# Patient Record
Sex: Male | Born: 1985 | Race: Black or African American | Hispanic: No | Marital: Single | Smoking: Former smoker
Health system: Southern US, Community
[De-identification: ages and names within clinical notes are randomized; demographics above are authoritative.]

## PROBLEM LIST (undated history)

## (undated) DIAGNOSIS — E119 Type 2 diabetes mellitus without complications: Secondary | ICD-10-CM

## (undated) HISTORY — PX: NO PAST SURGERIES: SHX2092

---
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
## 2005-08-23 ENCOUNTER — Emergency Department (HOSPITAL_COMMUNITY): Admission: EM | Admit: 2005-08-23 | Discharge: 2005-08-23 | Payer: Self-pay | Admitting: Emergency Medicine

## 2007-05-30 ENCOUNTER — Emergency Department (HOSPITAL_COMMUNITY): Admission: EM | Admit: 2007-05-30 | Discharge: 2007-05-30 | Payer: Self-pay | Admitting: Emergency Medicine

## 2008-10-19 ENCOUNTER — Telehealth (INDEPENDENT_AMBULATORY_CARE_PROVIDER_SITE_OTHER): Payer: Self-pay | Admitting: *Deleted

## 2008-10-20 ENCOUNTER — Emergency Department (HOSPITAL_COMMUNITY): Admission: EM | Admit: 2008-10-20 | Discharge: 2008-10-20 | Payer: Self-pay | Admitting: Emergency Medicine

## 2011-01-05 ENCOUNTER — Emergency Department (HOSPITAL_COMMUNITY)
Admission: EM | Admit: 2011-01-05 | Discharge: 2011-01-05 | Payer: Self-pay | Source: Home / Self Care | Admitting: Family Medicine

## 2011-07-01 IMAGING — CR DG CHEST 2V
2 series · 2 of 2 positions shown · non-contrast
Comparison: None

CLINICAL DATA: Cough.  Upper respiratory tract infection.

CHEST - 2 VIEW

[view not recorded (1 of 2)]
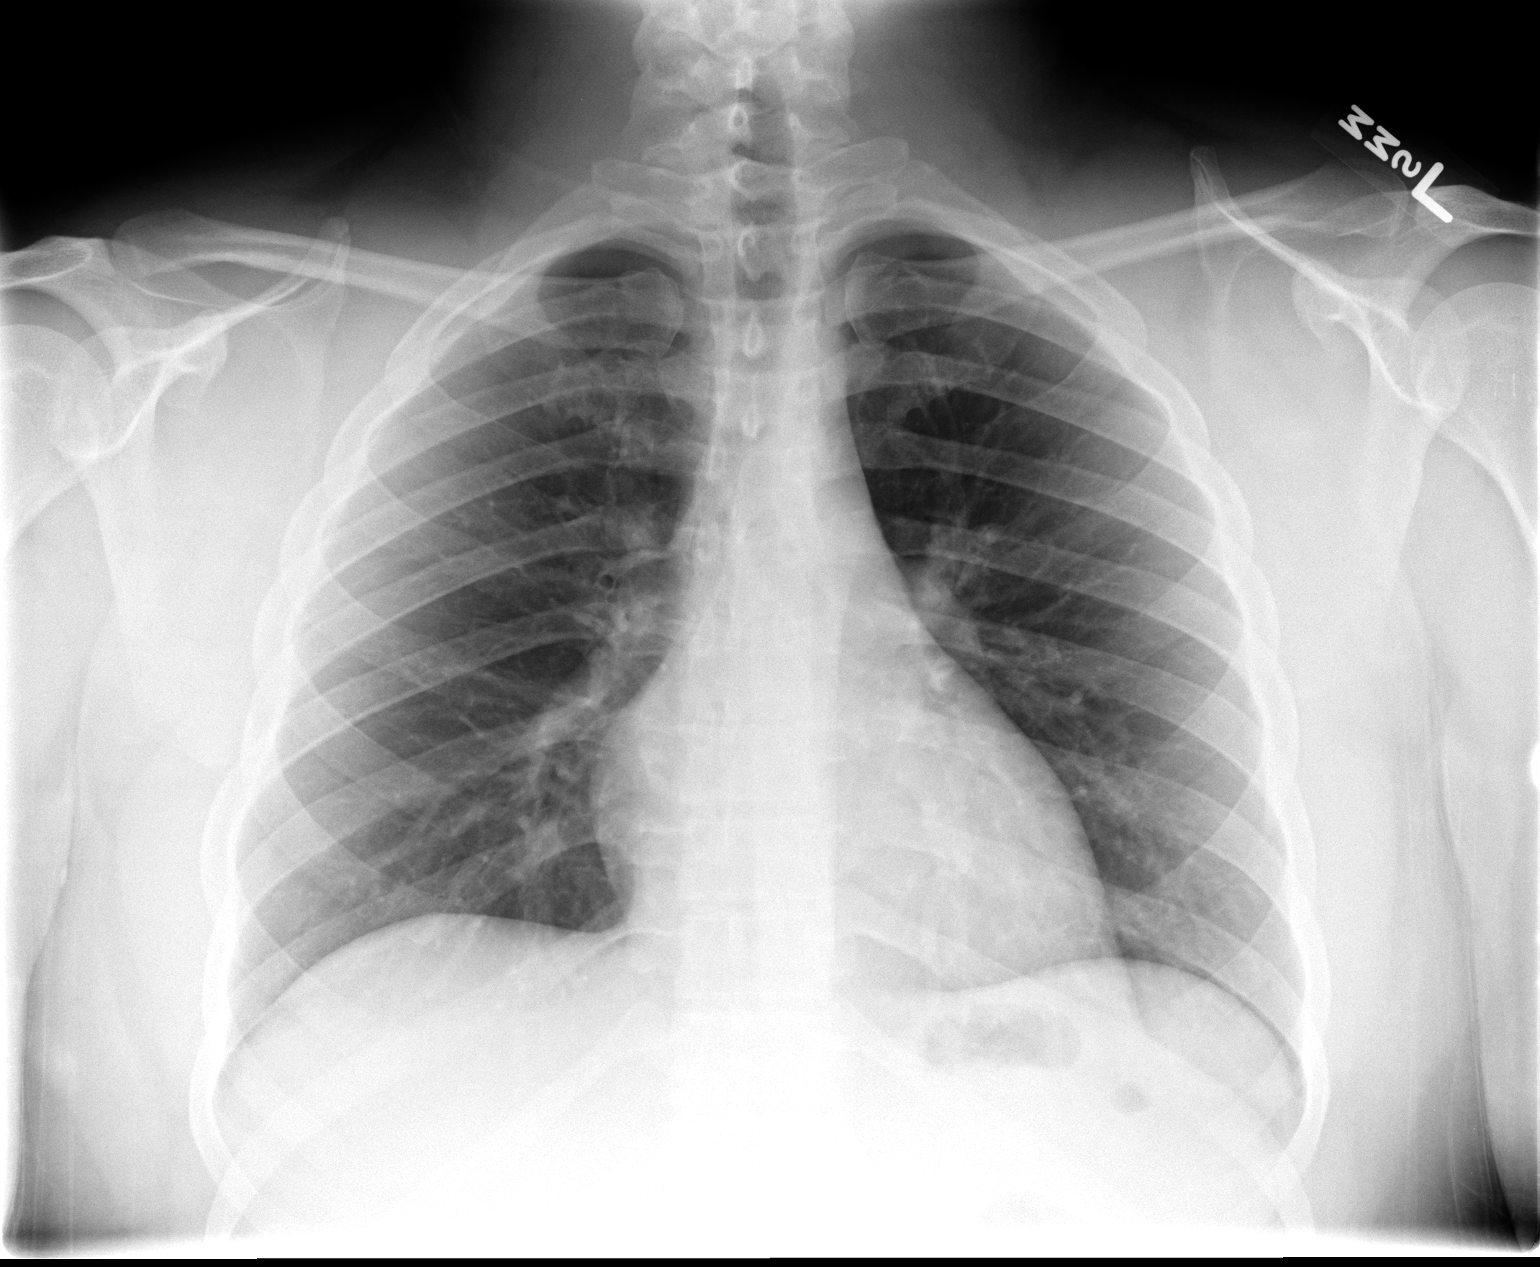

[view not recorded (2 of 2)]
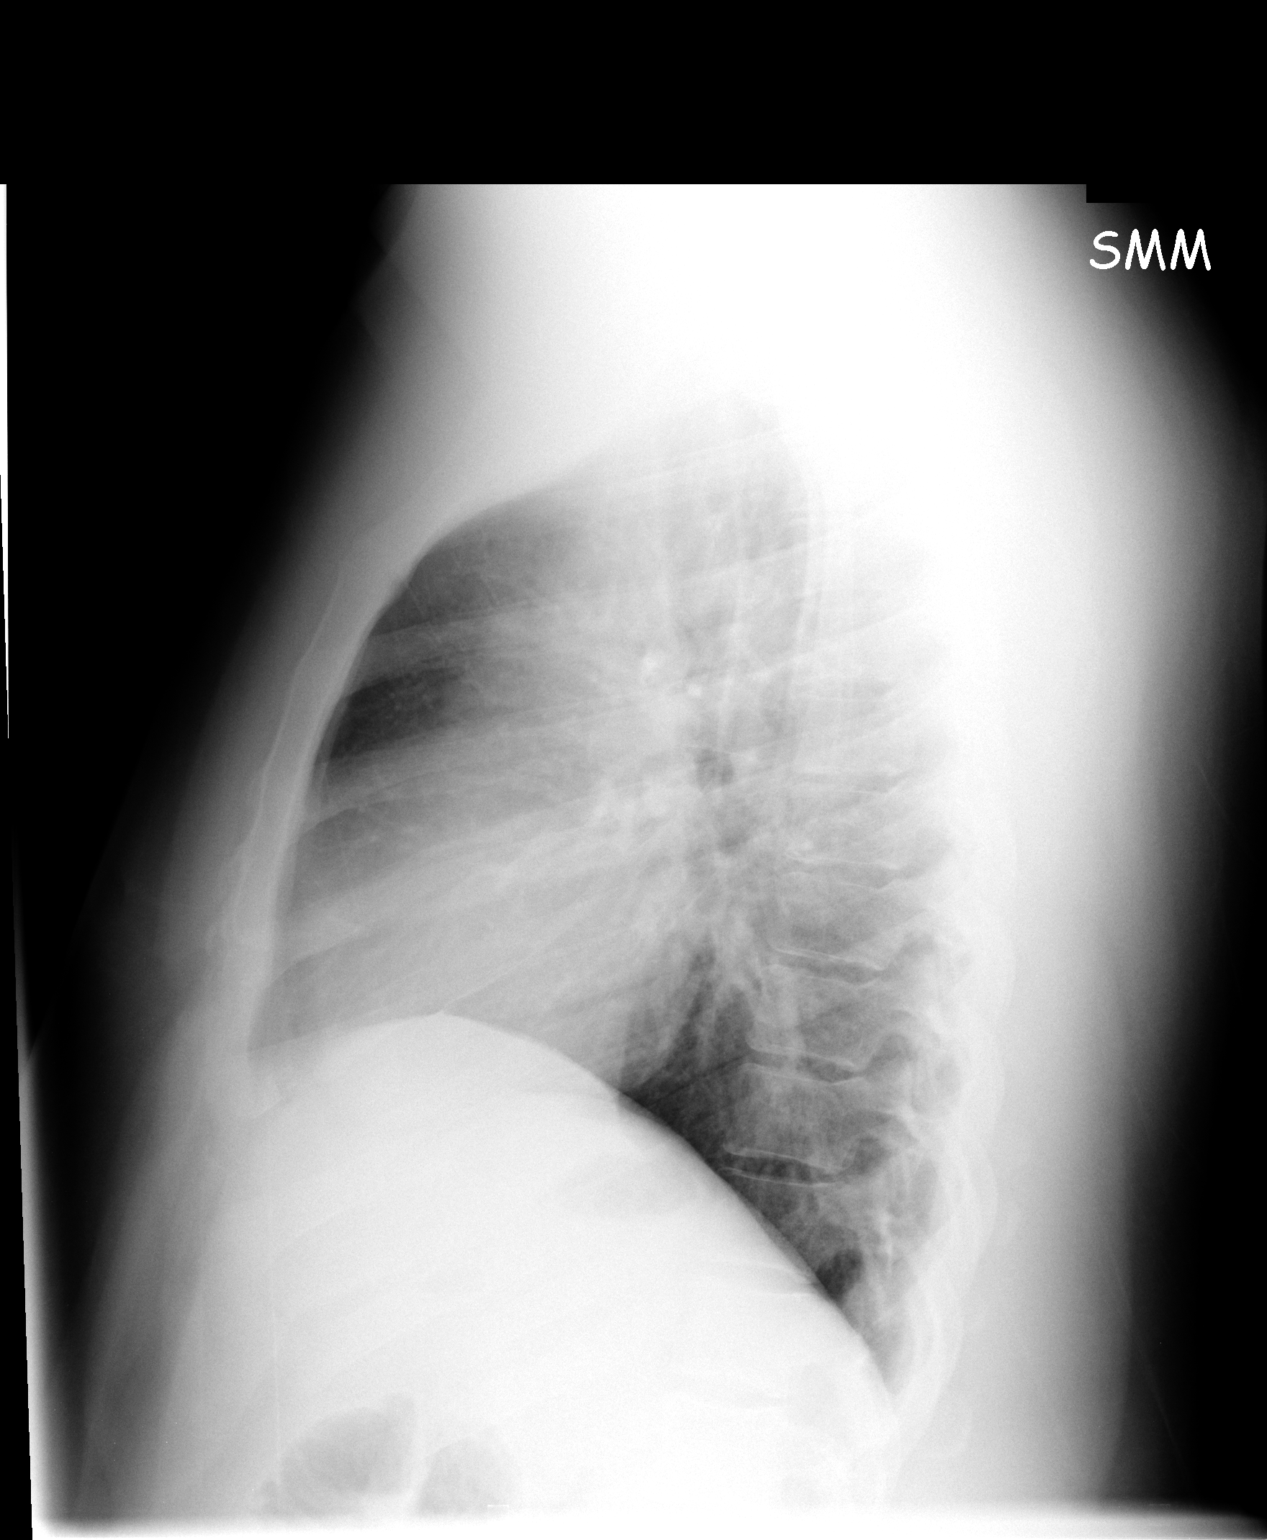

[2 of 2 positions shown; findings below may reference images not displayed]

FINDINGS: The heart size and mediastinal contours are within normal
limits.  Both lungs are clear.  The visualized skeletal structures
are unremarkable.
IMPRESSION: No active cardiopulmonary abnormalities.

## 2011-09-26 LAB — CULTURE, ROUTINE-ABSCESS

## 2019-06-09 ENCOUNTER — Ambulatory Visit (INDEPENDENT_AMBULATORY_CARE_PROVIDER_SITE_OTHER): Payer: BC Managed Care – PPO | Admitting: Family Medicine

## 2019-06-09 ENCOUNTER — Other Ambulatory Visit: Payer: Self-pay

## 2019-06-09 ENCOUNTER — Encounter: Payer: Self-pay | Admitting: Family Medicine

## 2019-06-09 DIAGNOSIS — G4719 Other hypersomnia: Secondary | ICD-10-CM | POA: Diagnosis not present

## 2019-06-09 DIAGNOSIS — R0681 Apnea, not elsewhere classified: Secondary | ICD-10-CM

## 2019-06-09 DIAGNOSIS — R0683 Snoring: Secondary | ICD-10-CM

## 2019-06-09 NOTE — Progress Notes (Deleted)
Patient ID: Ernest Austin, male   DOB: 1986-04-01, 33 y.o.   MRN: [id number]    No sleep  Reports that his wife over 1 year  Fatigued and wake up feels has  He now works 1st year   [employer] and 5'2 He gained 40 lbs over the last year Occasionally has shortness or breath with exertional  More recent headaches.  He Doses  He has a uncle

## 2019-06-09 NOTE — Progress Notes (Deleted)
Worked up patient for their telephone visit with provider Molli Barrows, FNP-C. Verified date of birth. Patient has concerns about sleep apnea. Has the following complaints: loud snoring, daytime sleepiness, feels like he's choking in his sleep, wife states that he stops breathing in his sleep & he feels like neck size is greater than 17". Has never been treated for BP issues, is less than 50 KWalker, CMA.
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
## 2019-06-25 ENCOUNTER — Telehealth: Payer: Self-pay

## 2019-06-25 NOTE — Telephone Encounter (Signed)
Called patient to do their pre-visit COVID screening.  Have you been tested for COVID or are you currently waiting for COVID test results? no  Have you recently traveled internationally([place], [place], [place], [place], [place]) or within the [place] to a hotspot area([place], [place], [place], [place], [place])? no  Are you currently experiencing any of the following: fever, cough, SHOB, fatigue, body aches, loss of smell, rash, diarrhea, vomiting, severe headaches, weakness, sore throat? no  Have you been in contact with anyone who has recently travelled? no  Have you been in contact with anyone who is experiencing any of the above symptoms or been diagnosed with COVID  or works in or has recently visited a SNF? no  Reminded patient to come fasting for bloodwork,

## 2019-06-26 ENCOUNTER — Other Ambulatory Visit: Payer: Self-pay

## 2019-06-26 ENCOUNTER — Encounter: Payer: Self-pay | Admitting: Family Medicine

## 2019-06-26 ENCOUNTER — Ambulatory Visit (INDEPENDENT_AMBULATORY_CARE_PROVIDER_SITE_OTHER): Payer: BC Managed Care – PPO | Admitting: Family Medicine

## 2019-06-26 VITALS — BP 146/92 | HR 90 | Temp 97.3°F | Resp 18 | Ht 62.0 in | Wt 278.0 lb

## 2019-06-26 DIAGNOSIS — Z114 Encounter for screening for human immunodeficiency virus [HIV]: Secondary | ICD-10-CM

## 2019-06-26 DIAGNOSIS — Z6841 Body Mass Index (BMI) 40.0 and over, adult: Secondary | ICD-10-CM

## 2019-06-26 DIAGNOSIS — Z13220 Encounter for screening for lipoid disorders: Secondary | ICD-10-CM

## 2019-06-26 DIAGNOSIS — R030 Elevated blood-pressure reading, without diagnosis of hypertension: Secondary | ICD-10-CM | POA: Diagnosis not present

## 2019-06-26 DIAGNOSIS — Z1389 Encounter for screening for other disorder: Secondary | ICD-10-CM

## 2019-06-26 DIAGNOSIS — Z0001 Encounter for general adult medical examination with abnormal findings: Secondary | ICD-10-CM | POA: Diagnosis not present

## 2019-06-26 DIAGNOSIS — Z0000 Encounter for general adult medical examination without abnormal findings: Secondary | ICD-10-CM

## 2019-06-26 DIAGNOSIS — Z130 Encounter for screening for diseases of the blood and blood-forming organs and certain disorders involving the immune mechanism: Secondary | ICD-10-CM

## 2019-06-26 DIAGNOSIS — Z131 Encounter for screening for diabetes mellitus: Secondary | ICD-10-CM

## 2019-06-26 DIAGNOSIS — Z1329 Encounter for screening for other suspected endocrine disorder: Secondary | ICD-10-CM

## 2019-06-26 NOTE — Progress Notes (Signed)
Patient ID: Ernest Austin, male    DOB: February 14, 1986, 33 y.o.   MRN: [id number]  PCP: Scot Jun, FNP  Chief Complaint  Patient presents with  . Annual Exam    Subjective:  HPI Ernest Austin is a 33 y.o. male, former smoker presents for complete physical exam.  Chronic conditions: suspect sleep apnea, sleep study pending    Health Promotion: Routine physical activity: No routine physical activity. Current BMI: Body mass index is 50.85 kg/m. Dietary habits: skips breakfast. Lunch and Dinner. Snacks at least 3-4 times per the day. Drinks water occasionally. Cook mostly at home. Endorses heavy intake of soda, average 8 per day.  Health Screening Current/Overdue:   Immunizations: up to date  Last Dental Exam: Last Eye Exam:  January 2019    Family History  Problem Relation Age of Onset  . Diabetes Mother   . Hypertension Father   . Heart attack Neg Hx   . Stroke Neg Hx     No Known Allergies  Social History   Socioeconomic History  . Marital status: Single    Spouse name: Not on file  . Number of children: Not on file  . Years of education: Not on file  . Highest education level: Not on file  Occupational History  . Not on file  Social Needs  . Financial resource strain: Not on file  . Food insecurity    Worry: Not on file    Inability: Not on file  . Transportation needs    Medical: Not on file    Non-medical: Not on file  Tobacco Use  . Smoking status: Former Smoker    Quit date: 2016    Years since quitting: 4.5  . Smokeless tobacco: Never Used  Substance and Sexual Activity  . Alcohol use: Not on file  . Drug use: Not on file  . Sexual activity: Not on file  Lifestyle  . Physical activity    Days per week: Not on file    Minutes per session: Not on file  . Stress: Not on file  Relationships  . Social Herbalist on phone: Not on file    Gets together: Not on file    Attends religious service: Not on file    Active  member of club or organization: Not on file    Attends meetings of clubs or organizations: Not on file    Relationship status: Not on file  . Intimate partner violence    Fear of current or ex partner: Not on file    Emotionally abused: Not on file    Physically abused: Not on file    Forced sexual activity: Not on file  Other Topics Concern  . Not on file  Social History Narrative  . Not on file    Review of Systems  Pertinent negatives listed in HPI  Past Medical, Surgical Family and Social History reviewed and updated.  Objective:   Today's Vitals   06/26/19 0854  BP: (!) 146/92  Pulse: 90  Resp: 18  Temp: (!) 97.3 F (36.3 C)  TempSrc: Temporal  SpO2: 96%  Weight: 278 lb (126.1 kg)  Height: 5\' 2"  (1.575 m)    Wt Readings from Last 3 Encounters:  06/26/19 278 lb (126.1 kg)    Physical Exam Constitutional: Patient appears well-developed and well-nourished. No distress. HENT: Normocephalic, atraumatic, External right and left ear normal. Oropharynx is clear and moist.  Eyes: Conjunctivae and EOM  are normal. PERRLA, no scleral icterus. Neck: Normal ROM. Neck supple. No JVD. No tracheal deviation. No thyromegaly. CVS: RRR, S1/S2 +, no murmurs, no gallops, no carotid bruit.  Pulmonary: Effort and breath sounds normal, no stridor, rhonchi, wheezes, rales.  Abdominal: Soft. BS +, no distension, tenderness, rebound or guarding.  Musculoskeletal: Normal range of motion. No edema and no tenderness.  Neuro: Alert. Normal reflexes, muscle tone coordination. No cranial nerve deficit. Skin: Skin is warm and dry. No rash noted. Not diaphoretic. No erythema. No pallor. Psychiatric: Normal mood and affect. Behavior, judgment, thought content normal.   Assessment & Plan:  1. Annual physical exam Age-appropriate anticipatory guidance provided.  2. Screening for diabetes mellitus - Hemoglobin A1c - Comprehensive metabolic panel  3. Screening for thyroid disorder - Thyroid  Panel With TSH  4. Screening for deficiency anemia - CBC with Differential  5. Screening, lipid  -Lipid panel  6. Screening for blood or protein in urine - POCT URINALYSIS DIP (CLINITEK)  7. Screening for HIV (human immunodeficiency virus) - HIV antibody (with reflex)   8. Body mass index (BMI) of 50-59.9 in adult [facility](HCC) Encouraged efforts to reduce weight include engaging in physical activity as tolerated with goal of 150 minutes per week. Improve dietary choices and eat a meal regimen consistent with a Mediterranean or DASH diet. Reduce simple carbohydrates. Do not skip meals and eat healthy snacks throughout the day to avoid over-eating at dinner. Set a goal weight loss that is achievable for you.  9.  Elevated blood pressure reading without a diagnosis of hypertension Elevated x2 readings today.  Patient will return in 2 weeks for blood pressure follow-up.  If blood pressure remains greater than 140/90 will start losartan 25 mg once daily.  Patient has multiple risk factors for cardiovascular disease consisting of metal, African-American, morbid obesity, sedentary lifestyle, poor diet.  Joaquin CourtsKimberly Lawanna Cecere, FNP [facility] [address], [place] [id number] [phone]: [phone]

## 2019-06-26 NOTE — Patient Instructions (Addendum)
Preventive Care 34-34 Years Old, Male Preventive care refers to lifestyle choices and visits with your health care provider that can promote health and wellness. This includes:  A yearly physical exam. This is also called an annual well check.  Regular dental and eye exams.  Immunizations.  Screening for certain conditions.  Healthy lifestyle choices, such as eating a healthy diet, getting regular exercise, not using drugs or products that contain nicotine and tobacco, and limiting alcohol use. What can I expect for my preventive care visit? Physical exam Your health care provider will check:  Height and weight. These may be used to calculate body mass index (BMI), which is a measurement that tells if you are at a healthy weight.  Heart rate and blood pressure.  Your skin for abnormal spots. Counseling Your health care provider may ask you questions about:  Alcohol, tobacco, and drug use.  Emotional well-being.  Home and relationship well-being.  Sexual activity.  Eating habits.  Work and work Statistician. What immunizations do I need?  Influenza (flu) vaccine  This is recommended every year. Tetanus, diphtheria, and pertussis (Tdap) vaccine  You may need a Td booster every 10 years. Varicella (chickenpox) vaccine  You may need this vaccine if you have not already been vaccinated. Human papillomavirus (HPV) vaccine  If recommended by your health care provider, you may need three doses over 6 months. Measles, mumps, and rubella (MMR) vaccine  You may need at least one dose of MMR. You may also need a second dose. Meningococcal conjugate (MenACWY) vaccine  One dose is recommended if you are 34-13 years old and a Market researcher living in a residence hall, or if you have one of several medical conditions. You may also need additional booster doses. Pneumococcal conjugate (PCV13) vaccine  You may need this if you have certain conditions and were not  previously vaccinated. Pneumococcal polysaccharide (PPSV23) vaccine  You may need one or two doses if you smoke cigarettes or if you have certain conditions. Hepatitis A vaccine  You may need this if you have certain conditions or if you travel or work in places where you may be exposed to hepatitis A. Hepatitis B vaccine  You may need this if you have certain conditions or if you travel or work in places where you may be exposed to hepatitis B. Haemophilus influenzae type b (Hib) vaccine  You may need this if you have certain risk factors. You may receive vaccines as individual doses or as more than one vaccine together in one shot (combination vaccines). Talk with your health care provider about the risks and benefits of combination vaccines. What tests do I need? Blood tests  Lipid and cholesterol levels. These may be checked every 5 years starting at age 34.  Hepatitis C test.  Hepatitis B test. Screening   Diabetes screening. This is done by checking your blood sugar (glucose) after you have not eaten for a while (fasting).  Sexually transmitted disease (STD) testing. Talk with your health care provider about your test results, treatment options, and if necessary, the need for more tests. Follow these instructions at home: Eating and drinking   Eat a diet that includes fresh fruits and vegetables, whole grains, lean protein, and low-fat dairy products.  Take vitamin and mineral supplements as recommended by your health care provider.  Do not drink alcohol if your health care provider tells you not to drink.  If you drink alcohol: ? Limit how much you have to 0-2  drinks a day. ? Be aware of how much alcohol is in your drink. In the [place] one drink equals one 12 oz bottle of beer (355 mL), one 5 oz glass of wine (148 mL), or one 1 oz glass of hard liquor (44 mL). Lifestyle  Take daily care of your teeth and gums.  Stay active. Exercise for at least 30 minutes on 5 or  more days each week.  Do not use any products that contain nicotine or tobacco, such as cigarettes, e-cigarettes, and chewing tobacco. If you need help quitting, ask your health care provider.  If you are sexually active, practice safe sex. Use a condom or other form of protection to prevent STIs (sexually transmitted infections). What's next?  Go to your health care provider once a year for a well check visit.  Ask your health care provider how often you should have your eyes and teeth checked.  Stay up to date on all vaccines. This information is not intended to replace advice given to you by your health care provider. Make sure you discuss any questions you have with your health care provider. Document Released: 01/22/2002 Document Revised: 11/20/2018 Document Reviewed: 11/20/2018 Elsevier Patient Education  2020 [place] Maintenance, Male Adopting a healthy lifestyle and getting preventive care are important in promoting health and wellness. Ask your health care provider about:  The right schedule for you to have regular tests and exams.  Things you can do on your own to prevent diseases and keep yourself healthy. What should I know about diet, weight, and exercise? Eat a healthy diet   Eat a diet that includes plenty of vegetables, fruits, low-fat dairy products, and lean protein.  Do not eat a lot of foods that are high in solid fats, added sugars, or sodium. Maintain a healthy weight Body mass index (BMI) is a measurement that can be used to identify possible weight problems. It estimates body fat based on height and weight. Your health care provider can help determine your BMI and help you achieve or maintain a healthy weight. Get regular exercise Get regular exercise. This is one of the most important things you can do for your health. Most adults should:  Exercise for at least 150 minutes each week. The exercise should increase your heart rate and make you  sweat (moderate-intensity exercise).  Do strengthening exercises at least twice a week. This is in addition to the moderate-intensity exercise.  Spend less time sitting. Even light physical activity can be beneficial. Watch cholesterol and blood lipids Have your blood tested for lipids and cholesterol at 34 years of age, then have this test every 5 years. You may need to have your cholesterol levels checked more often if:  Your lipid or cholesterol levels are high.  You are older than 34 years of age.  You are at high risk for heart disease. What should I know about cancer screening? Many types of cancers can be detected early and may often be prevented. Depending on your health history and family history, you may need to have cancer screening at various ages. This may include screening for:  Colorectal cancer.  Prostate cancer.  Skin cancer.  Lung cancer. What should I know about heart disease, diabetes, and high blood pressure? Blood pressure and heart disease  High blood pressure causes heart disease and increases the risk of stroke. This is more likely to develop in people who have high blood pressure readings, are of African descent, or  are overweight.  Talk with your health care provider about your target blood pressure readings.  Have your blood pressure checked: ? Every 3-5 years if you are 28-42 years of age. ? Every year if you are 32 years old or older.  If you are between the ages of 43 and 10 and are a current or former smoker, ask your health care provider if you should have a one-time screening for abdominal aortic aneurysm (AAA). Diabetes Have regular diabetes screenings. This checks your fasting blood sugar level. Have the screening done:  Once every three years after age 4 if you are at a normal weight and have a low risk for diabetes.  More often and at a younger age if you are overweight or have a high risk for diabetes. What should I know about  preventing infection? Hepatitis B If you have a higher risk for hepatitis B, you should be screened for this virus. Talk with your health care provider to find out if you are at risk for hepatitis B infection. Hepatitis C Blood testing is recommended for:  Everyone born from [birth year] through 1966.  Anyone with known risk factors for hepatitis C. Sexually transmitted infections (STIs)  You should be screened each year for STIs, including gonorrhea and chlamydia, if: ? You are sexually active and are younger than 34 years of age. ? You are older than 34 years of age and your health care provider tells you that you are at risk for this type of infection. ? Your sexual activity has changed since you were last screened, and you are at increased risk for chlamydia or gonorrhea. Ask your health care provider if you are at risk.  Ask your health care provider about whether you are at high risk for HIV. Your health care provider may recommend a prescription medicine to help prevent HIV infection. If you choose to take medicine to prevent HIV, you should first get tested for HIV. You should then be tested every 3 months for as long as you are taking the medicine. Follow these instructions at home: Lifestyle  Do not use any products that contain nicotine or tobacco, such as cigarettes, e-cigarettes, and chewing tobacco. If you need help quitting, ask your health care provider.  Do not use street drugs.  Do not share needles.  Ask your health care provider for help if you need support or information about quitting drugs. Alcohol use  Do not drink alcohol if your health care provider tells you not to drink.  If you drink alcohol: ? Limit how much you have to 0-2 drinks a day. ? Be aware of how much alcohol is in your drink. In the [place] one drink equals one 12 oz bottle of beer (355 mL), one 5 oz glass of wine (148 mL), or one 1 oz glass of hard liquor (44 mL). General instructions  Schedule  regular health, dental, and eye exams.  Stay current with your vaccines.  Tell your health care provider if: ? You often feel depressed. ? You have ever been abused or do not feel safe at home. Summary  Adopting a healthy lifestyle and getting preventive care are important in promoting health and wellness.  Follow your health care provider's instructions about healthy diet, exercising, and getting tested or screened for diseases.  Follow your health care provider's instructions on monitoring your cholesterol and blood pressure. This information is not intended to replace advice given to you by your health care provider. Make sure you  discuss any questions you have with your health care provider. Document Released: 05/24/2008 Document Revised: 11/19/2018 Document Reviewed: 11/19/2018 Elsevier Patient Education  2020 [employer].    Hypertension, Adult Hypertension is another name for high blood pressure. High blood pressure forces your heart to work harder to pump blood. This can cause problems over time. There are two numbers in a blood pressure reading. There is a top number (systolic) over a bottom number (diastolic). It is best to have a blood pressure that is below 120/80. Healthy choices can help lower your blood pressure, or you may need medicine to help lower it. What are the causes? The cause of this condition is not known. Some conditions may be related to high blood pressure. What increases the risk?  Smoking.  Having type 2 diabetes mellitus, high cholesterol, or both.  Not getting enough exercise or physical activity.  Being overweight.  Having too much fat, sugar, calories, or salt (sodium) in your diet.  Drinking too much alcohol.  Having long-term (chronic) kidney disease.  Having a family history of high blood pressure.  Age. Risk increases with age.  Race. You may be at higher risk if you are African American.  Gender. Men are at higher risk than  women before age 59. After age 67, women are at higher risk than men.  Having obstructive sleep apnea.  Stress. What are the signs or symptoms?  High blood pressure may not cause symptoms. Very high blood pressure (hypertensive crisis) may cause: ? Headache. ? Feelings of worry or nervousness (anxiety). ? Shortness of breath. ? Nosebleed. ? A feeling of being sick to your stomach (nausea). ? Throwing up (vomiting). ? Changes in how you see. ? Very bad chest pain. ? Seizures. How is this treated?  This condition is treated by making healthy lifestyle changes, such as: ? Eating healthy foods. ? Exercising more. ? Drinking less alcohol.  Your health care provider may prescribe medicine if lifestyle changes are not enough to get your blood pressure under control, and if: ? Your top number is above 130. ? Your bottom number is above 80.  Your personal target blood pressure may vary. Follow these instructions at home: Eating and drinking   If told, follow the DASH eating plan. To follow this plan: ? Fill one half of your plate at each meal with fruits and vegetables. ? Fill one fourth of your plate at each meal with whole grains. Whole grains include whole-wheat pasta, brown rice, and whole-grain bread. ? Eat or drink low-fat dairy products, such as skim milk or low-fat yogurt. ? Fill one fourth of your plate at each meal with low-fat (lean) proteins. Low-fat proteins include fish, chicken without skin, eggs, beans, and tofu. ? Avoid fatty meat, cured and processed meat, or chicken with skin. ? Avoid pre-made or processed food.  Eat less than 1,500 mg of salt each day.  Do not drink alcohol if: ? Your doctor tells you not to drink. ? You are pregnant, may be pregnant, or are planning to become pregnant.  If you drink alcohol: ? Limit how much you use to:  0-1 drink a day for women.  0-2 drinks a day for men. ? Be aware of how much alcohol is in your drink. In the [place]  one drink equals one 12 oz bottle of beer (355 mL), one 5 oz glass of wine (148 mL), or one 1 oz glass of hard liquor (44 mL). Lifestyle   Work with your  doctor to stay at a healthy weight or to lose weight. Ask your doctor what the best weight is for you.  Get at least 30 minutes of exercise most days of the week. This may include walking, swimming, or biking.  Get at least 30 minutes of exercise that strengthens your muscles (resistance exercise) at least 3 days a week. This may include lifting weights or doing Pilates.  Do not use any products that contain nicotine or tobacco, such as cigarettes, e-cigarettes, and chewing tobacco. If you need help quitting, ask your doctor.  Check your blood pressure at home as told by your doctor.  Keep all follow-up visits as told by your doctor. This is important. Medicines  Take over-the-counter and prescription medicines only as told by your doctor. Follow directions carefully.  Do not skip doses of blood pressure medicine. The medicine does not work as well if you skip doses. Skipping doses also puts you at risk for problems.  Ask your doctor about side effects or reactions to medicines that you should watch for. Contact a doctor if you:  Think you are having a reaction to the medicine you are taking.  Have headaches that keep coming back (recurring).  Feel dizzy.  Have swelling in your ankles.  Have trouble with your vision. Get help right away if you:  Get a very bad headache.  Start to feel mixed up (confused).  Feel weak or numb.  Feel faint.  Have very bad pain in your: ? Chest. ? Belly (abdomen).  Throw up more than once.  Have trouble breathing. Summary  Hypertension is another name for high blood pressure.  High blood pressure forces your heart to work harder to pump blood.  For most people, a normal blood pressure is less than 120/80.  Making healthy choices can help lower blood pressure. If your blood  pressure does not get lower with healthy choices, you may need to take medicine. This information is not intended to replace advice given to you by your health care provider. Make sure you discuss any questions you have with your health care provider. Document Released: 05/14/2008 Document Revised: 08/06/2018 Document Reviewed: 08/06/2018 Elsevier Patient Education  2020 [place] and Cholesterol Restricted Eating Plan Getting too much fat and cholesterol in your diet may cause health problems. Choosing the right foods helps keep your fat and cholesterol at normal levels. This can keep you from getting certain diseases. Your doctor may recommend an eating plan that includes:  Total fat: ______% or less of total calories a day.  Saturated fat: ______% or less of total calories a day.  Cholesterol: less than _________mg a day.  Fiber: ______g a day. What are tips for following this plan? Meal planning  At meals, divide your plate into four equal parts: ? Fill one-half of your plate with vegetables and green salads. ? Fill one-fourth of your plate with whole grains. ? Fill one-fourth of your plate with low-fat (lean) protein foods.  Eat fish that is high in omega-3 fats at least two times a week. This includes mackerel, tuna, sardines, and salmon.  Eat foods that are high in fiber, such as whole grains, beans, apples, broccoli, carrots, peas, and barley. General tips   Work with your doctor to lose weight if you need to.  Avoid: ? Foods with added sugar. ? Fried foods. ? Foods with partially hydrogenated oils.  Limit alcohol intake to no more than 1 drink a day for nonpregnant women  and 2 drinks a day for men. One drink equals 12 oz of beer, 5 oz of wine, or 1 oz of hard liquor. Reading food labels  Check food labels for: ? Trans fats. ? Partially hydrogenated oils. ? Saturated fat (g) in each serving. ? Cholesterol (mg) in each serving. ? Fiber (g) in each  serving.  Choose foods with healthy fats, such as: ? Monounsaturated fats. ? Polyunsaturated fats. ? Omega-3 fats.  Choose grain products that have whole grains. Look for the word "whole" as the first word in the ingredient list. Cooking  Cook foods using low-fat methods. These include baking, boiling, grilling, and broiling.  Eat more home-cooked foods. Eat at restaurants and buffets less often.  Avoid cooking using saturated fats, such as butter, cream, palm oil, palm kernel oil, and coconut oil. Recommended foods  Fruits  All fresh, canned (in natural juice), or frozen fruits. Vegetables  Fresh or frozen vegetables (raw, steamed, roasted, or grilled). Green salads. Grains  Whole grains, such as whole wheat or whole grain breads, crackers, cereals, and pasta. Unsweetened oatmeal, bulgur, barley, quinoa, or brown rice. Corn or whole wheat flour tortillas. Meats and other protein foods  Ground beef (85% or leaner), grass-fed beef, or beef trimmed of fat. Skinless chicken or [place]. Ground chicken or [place]. Pork trimmed of fat. All fish and seafood. Egg whites. Dried beans, peas, or lentils. Unsalted nuts or seeds. Unsalted canned beans. Nut butters without added sugar or oil. Dairy  Low-fat or nonfat dairy products, such as skim or 1% milk, 2% or reduced-fat cheeses, low-fat and fat-free ricotta or cottage cheese, or plain low-fat and nonfat yogurt. Fats and oils  Tub margarine without trans fats. Light or reduced-fat mayonnaise and salad dressings. Avocado. Olive, canola, sesame, or safflower oils. The items listed above may not be a complete list of foods and beverages you can eat. Contact a dietitian for more information. Foods to avoid Fruits  Canned fruit in heavy syrup. Fruit in cream or butter sauce. Fried fruit. Vegetables  Vegetables cooked in cheese, cream, or butter sauce. Fried vegetables. Grains  White bread. White pasta. White rice. Cornbread. Bagels,  pastries, and croissants. Crackers and snack foods that contain trans fat and hydrogenated oils. Meats and other protein foods  Fatty cuts of meat. Ribs, chicken wings, bacon, sausage, bologna, salami, chitterlings, fatback, hot dogs, bratwurst, and packaged lunch meats. Liver and organ meats. Whole eggs and egg yolks. Chicken and [place] with skin. Fried meat. Dairy  Whole or 2% milk, cream, half-and-half, and cream cheese. Whole milk cheeses. Whole-fat or sweetened yogurt. Full-fat cheeses. Nondairy creamers and whipped toppings. Processed cheese, cheese spreads, and cheese curds. Beverages  Alcohol. Sugar-sweetened drinks such as sodas, lemonade, and fruit drinks. Fats and oils  Butter, stick margarine, lard, shortening, ghee, or bacon fat. Coconut, palm kernel, and palm oils. Sweets and desserts  Corn syrup, sugars, honey, and molasses. Candy. Jam and jelly. Syrup. Sweetened cereals. Cookies, pies, cakes, donuts, muffins, and ice cream. The items listed above may not be a complete list of foods and beverages you should avoid. Contact a dietitian for more information. Summary  Choosing the right foods helps keep your fat and cholesterol at normal levels. This can keep you from getting certain diseases.  At meals, fill one-half of your plate with vegetables and green salads.  Eat high-fiber foods, like whole grains, beans, apples, carrots, peas, and barley.  Limit added sugar, saturated fats, alcohol, and fried foods. This information is not  intended to replace advice given to you by your health care provider. Make sure you discuss any questions you have with your health care provider. Document Released: 05/27/2012 Document Revised: 07/30/2018 Document Reviewed: 08/13/2017 Elsevier Patient Education  2020 [employer].

## 2019-06-27 LAB — CBC WITH DIFFERENTIAL/PLATELET
Basophils Absolute: 0 10*3/uL (ref 0.0–0.2)
Basos: 0 %
EOS (ABSOLUTE): 0.1 10*3/uL (ref 0.0–0.4)
Eos: 2 %
Hematocrit: 41.6 % (ref 37.5–51.0)
Hemoglobin: 13.8 g/dL (ref 13.0–17.7)
Immature Grans (Abs): 0 10*3/uL (ref 0.0–0.1)
Immature Granulocytes: 0 %
Lymphocytes Absolute: 2.2 10*3/uL (ref 0.7–3.1)
Lymphs: 41 %
MCH: 30.1 pg (ref 26.6–33.0)
MCHC: 33.2 g/dL (ref 31.5–35.7)
MCV: 91 fL (ref 79–97)
Monocytes Absolute: 0.4 10*3/uL (ref 0.1–0.9)
Monocytes: 7 %
Neutrophils Absolute: 2.7 10*3/uL (ref 1.4–7.0)
Neutrophils: 50 %
Platelets: 322 10*3/uL (ref 150–450)
RBC: 4.58 x10E6/uL (ref 4.14–5.80)
RDW: 13.7 % (ref 11.6–15.4)
WBC: 5.4 10*3/uL (ref 3.4–10.8)

## 2019-06-27 LAB — COMPREHENSIVE METABOLIC PANEL
ALT: 27 IU/L (ref 0–44)
AST: 18 IU/L (ref 0–40)
Albumin/Globulin Ratio: 1.6 (ref 1.2–2.2)
Albumin: 4.3 g/dL (ref 4.0–5.0)
Alkaline Phosphatase: 85 IU/L (ref 39–117)
BUN/Creatinine Ratio: 7 — ABNORMAL LOW (ref 9–20)
BUN: 7 mg/dL (ref 6–20)
Bilirubin Total: 0.3 mg/dL (ref 0.0–1.2)
CO2: 22 mmol/L (ref 20–29)
Calcium: 9.4 mg/dL (ref 8.7–10.2)
Chloride: 102 mmol/L (ref 96–106)
Creatinine, Ser: 1.01 mg/dL (ref 0.76–1.27)
GFR calc Af Amer: 112 mL/min/{1.73_m2} (ref >59)
GFR calc non Af Amer: 97 mL/min/{1.73_m2} (ref >59)
Globulin, Total: 2.7 g/dL (ref 1.5–4.5)
Glucose: 129 mg/dL — ABNORMAL HIGH (ref 65–99)
Potassium: 3.8 mmol/L (ref 3.5–5.2)
Sodium: 139 mmol/L (ref 134–144)
Total Protein: 7 g/dL (ref 6.0–8.5)

## 2019-06-27 LAB — HEMOGLOBIN A1C
Est. average glucose Bld gHb Est-mCnc: 160 mg/dL
Hgb A1c MFr Bld: 7.2 % — ABNORMAL HIGH (ref 4.8–5.6)

## 2019-06-27 LAB — LIPID PANEL
Chol/HDL Ratio: 9.3 ratio — ABNORMAL HIGH (ref 0.0–5.0)
Cholesterol, Total: 242 mg/dL — ABNORMAL HIGH (ref 100–199)
HDL: 26 mg/dL — ABNORMAL LOW (ref >39)
LDL Calculated: 190 mg/dL — ABNORMAL HIGH (ref 0–99)
Triglycerides: 131 mg/dL (ref 0–149)
VLDL Cholesterol Cal: 26 mg/dL (ref 5–40)

## 2019-06-27 LAB — THYROID PANEL WITH TSH
Free Thyroxine Index: 1.7 (ref 1.2–4.9)
T3 Uptake Ratio: 26 % (ref 24–39)
T4, Total: 6.6 ug/dL (ref 4.5–12.0)
TSH: 2.41 u[IU]/mL (ref 0.450–4.500)

## 2019-06-27 LAB — HIV ANTIBODY (ROUTINE TESTING W REFLEX): HIV Screen 4th Generation wRfx: NONREACTIVE

## 2019-06-29 ENCOUNTER — Other Ambulatory Visit: Payer: Self-pay | Admitting: Family Medicine

## 2019-06-29 DIAGNOSIS — E119 Type 2 diabetes mellitus without complications: Secondary | ICD-10-CM

## 2019-06-29 DIAGNOSIS — E782 Mixed hyperlipidemia: Secondary | ICD-10-CM

## 2019-06-29 NOTE — Telephone Encounter (Signed)
Left voicemail asking patient to return call to primary care at [facility] at [phone]. Nat Christen, CMA

## 2019-06-29 NOTE — Telephone Encounter (Signed)
Please contact patient to advise of the following regarding his most recent labs:  A1C is 7.2, which is consistent with type 2 diabetes. He doesn't require insulin, however, I would like for him to start metformin 500 mg twice daily. Cholesterol is also abnormal and the American Diabetic Association also recommended treatment of cholesterol to reduce the risk of a heart attack or stroke.  Would like for him to also start atorvastatin 20 mg once a day. American Diabetic Association also recommends starting antihypertensive medication for kidney protection. Given BP was elevated during recent office visit, would like for him to start losartan 25 mg. Return in 8 weeks, fasting for repeat cholesterol check. Return in 3 week for blood pressure check after starting lisinopril.  Please schedule follow-up as patient has no follow-up on file.  A prescription for a glucometer is being sent to the pharmacy and I have referred you to diabetes education for further instruction on proper management of diabetes.  Route message back to once patient notified as I would like to send additional diabetes education via his Mychart.

## 2019-06-30 MED ORDER — ATORVASTATIN CALCIUM 20 MG PO TABS: 20.0000 mg | ORAL_TABLET | Freq: Every day | ORAL | 3 refills | Status: DC

## 2019-06-30 MED ORDER — METFORMIN HCL 500 MG PO TABS: 500.0000 mg | ORAL_TABLET | Freq: Two times a day (BID) | ORAL | 3 refills | Status: DC

## 2019-06-30 MED ORDER — BLOOD GLUCOSE MONITOR KIT: PACK | 0 refills | Status: AC

## 2019-06-30 MED ORDER — LOSARTAN POTASSIUM 25 MG PO TABS: 25.0000 mg | ORAL_TABLET | Freq: Every day | ORAL | 3 refills | Status: DC

## 2019-06-30 NOTE — Telephone Encounter (Signed)
Patient notified of lab results & recommendations. Expressed understanding. Is agreeable to all medication changes listed below. Prescriptions sent to pharmacy on file. Patient aware that he will be receiving a call from Diabetes Education. Scheduled nurse visit for 07/27/2019 @ 2 PM. Scheduled fasting lab appointment for 08/24/2019 @ 8:30 AM.  Sent diabetes education material to patient via [place].

## 2019-07-01 ENCOUNTER — Other Ambulatory Visit (HOSPITAL_COMMUNITY)
Admission: RE | Admit: 2019-07-01 | Discharge: 2019-07-01 | Disposition: A | Discharge status: Home / Self Care | Payer: BC Managed Care – PPO | Source: Ambulatory Visit | Attending: Internal Medicine | Admitting: Internal Medicine

## 2019-07-01 DIAGNOSIS — Z1159 Encounter for screening for other viral diseases: Secondary | ICD-10-CM | POA: Insufficient documentation

## 2019-07-01 LAB — SARS CORONAVIRUS 2 (TAT 6-24 HRS): SARS Coronavirus 2: NEGATIVE

## 2019-07-04 ENCOUNTER — Ambulatory Visit (HOSPITAL_BASED_OUTPATIENT_CLINIC_OR_DEPARTMENT_OTHER): Payer: BC Managed Care – PPO | Attending: Family Medicine | Admitting: Internal Medicine

## 2019-07-04 ENCOUNTER — Other Ambulatory Visit: Payer: Self-pay

## 2019-07-04 ENCOUNTER — Encounter: Payer: Self-pay | Admitting: Family Medicine

## 2019-07-04 DIAGNOSIS — R0683 Snoring: Secondary | ICD-10-CM | POA: Diagnosis present

## 2019-07-04 DIAGNOSIS — R0681 Apnea, not elsewhere classified: Secondary | ICD-10-CM

## 2019-07-04 DIAGNOSIS — G4733 Obstructive sleep apnea (adult) (pediatric): Secondary | ICD-10-CM | POA: Insufficient documentation

## 2019-07-04 DIAGNOSIS — G4719 Other hypersomnia: Secondary | ICD-10-CM | POA: Diagnosis not present

## 2019-07-05 DIAGNOSIS — R0681 Apnea, not elsewhere classified: Secondary | ICD-10-CM | POA: Insufficient documentation

## 2019-07-06 ENCOUNTER — Other Ambulatory Visit: Payer: Self-pay

## 2019-07-06 ENCOUNTER — Other Ambulatory Visit (HOSPITAL_BASED_OUTPATIENT_CLINIC_OR_DEPARTMENT_OTHER): Payer: Self-pay

## 2019-07-06 DIAGNOSIS — R0681 Apnea, not elsewhere classified: Secondary | ICD-10-CM

## 2019-07-06 DIAGNOSIS — R0683 Snoring: Secondary | ICD-10-CM

## 2019-07-06 DIAGNOSIS — G4719 Other hypersomnia: Secondary | ICD-10-CM

## 2019-07-08 DIAGNOSIS — R0681 Apnea, not elsewhere classified: Secondary | ICD-10-CM | POA: Diagnosis not present

## 2019-07-08 NOTE — Procedures (Signed)
Patient Name: Ernest Austin, Ernest Austin Date: 07/04/2019 Gender: Male D.O.B: 03/24/1986 Age (years): 32 Referring Provider: Molli Barrows FNP Height (inches): 60 Interpreting Physician: Baird Lyons MD, ABSM Weight (lbs): 177 RPSGT: Lanae Boast BMI: 32 MRN: [id number] Neck Size: 18.75  CLINICAL INFORMATION Sleep Study Type: Split Night CPAP Indication for sleep study: Excessive Daytime Sleepiness, Snoring, Witnessed Apneas Epworth Sleepiness Score: 14  SLEEP STUDY TECHNIQUE As per the AASM Manual for the Scoring of Sleep and Associated Events v2.3 (April 2016) with a hypopnea requiring 4% desaturations.  The channels recorded and monitored were frontal, central and occipital EEG, electrooculogram (EOG), submentalis EMG (chin), nasal and oral airflow, thoracic and abdominal wall motion, anterior tibialis EMG, snore microphone, electrocardiogram, and pulse oximetry. Continuous positive airway pressure (CPAP) was initiated when the patient met split night criteria and was titrated according to treat sleep-disordered breathing.  MEDICATIONS Medications self-administered by patient taken the night of the study : none reported  RESPIRATORY PARAMETERS Diagnostic  Total AHI (/hr): 12.3 RDI (/hr): 12.9 OA Index (/hr): 0.3 CA Index (/hr): 0.0 REM AHI (/hr): 38.7 NREM AHI (/hr): 9.8 Supine AHI (/hr): 12.3 Non-supine AHI (/hr): 0 Min O2 Sat (%): 86.0 Mean O2 (%): 94.6 Time below 88% (min): 0.7   Titration  Optimal Pressure (cm): 10 AHI at Optimal Pressure (/hr): 0.0 Min O2 at Optimal Pressure (%): 93.0 Supine % at Optimal (%): 12 Sleep % at Optimal (%): 98   SLEEP ARCHITECTURE The recording time for the entire night was 423.6 minutes.  During a baseline period of 238.9 minutes, the patient slept for 180.9 minutes in REM and nonREM, yielding a sleep efficiency of 75.7%%. Sleep onset after lights out was 24.5 minutes with a REM latency of 85.0 minutes. The patient spent  15.2%% of the night in stage N1 sleep, 76.2%% in stage N2 sleep, 0.0%% in stage N3 and 8.6% in REM.  During the titration period of 182.9 minutes, the patient slept for 119.2 minutes in REM and nonREM, yielding a sleep efficiency of 65.2%%. Sleep onset after CPAP initiation was 0.7 minutes with a REM latency of 98.5 minutes. The patient spent 14.7%% of the night in stage N1 sleep, 60.6%% in stage N2 sleep, 0.0%% in stage N3 and 24.8% in REM.  CARDIAC DATA The 2 lead EKG demonstrated sinus rhythm. The mean heart rate was 100.0 beats per minute. Other EKG findings include: None.  LEG MOVEMENT DATA The total Periodic Limb Movements of Sleep (PLMS) were 0. The PLMS index was 0.0 .  IMPRESSIONS - Mild obstructive sleep apnea occurred during the diagnostic portion of the study (AHI = 12.3 /hour). An optimal PAP pressure was selected for this patient ( 10 cm of water) - No significant central sleep apnea occurred during the diagnostic portion of the study (CAI = 0.0/hour). - Mild oxygen desaturation was noted during the diagnostic portion of the study (Min O2 = 86.0%). Min sat on CPAP 10 was 93%. - The patient snored with loud snoring volume during the diagnostic portion of the study. - No cardiac abnormalities were noted during this study. - Clinically significant periodic limb movements did not occur during sleep.  DIAGNOSIS - Obstructive Sleep Apnea (327.23 [G47.33 ICD-10])  RECOMMENDATIONS - Trial of CPAP therapy on 10 cm H2O or autopap 5-15. - Patient used a Medium size Resmed Full Face Mask AirFit F20 mask and heated humidification. - Be careful with alcohol, sedatives and other CNS depressants that may worsen sleep apnea and disrupt normal sleep  architecture. - Sleep hygiene should be reviewed to assess factors that may improve sleep quality. - Weight management and regular exercise should be initiated or continued.  [Electronically signed] 07/08/2019 01:29 PM  Baird Lyons MD, [place], American Board of Sleep Medicine   NPI: [id number]                        [place], [place] [facility]  ELECTRONICALLY SIGNED ON:  07/08/2019, 1:30 PM [place] PH: [phone]   FX: [phone] [place]

## 2019-07-10 ENCOUNTER — Other Ambulatory Visit: Payer: Self-pay | Admitting: Family Medicine

## 2019-07-10 DIAGNOSIS — G4733 Obstructive sleep apnea (adult) (pediatric): Secondary | ICD-10-CM

## 2019-07-13 ENCOUNTER — Telehealth: Payer: Self-pay

## 2019-07-13 ENCOUNTER — Encounter: Payer: Self-pay | Admitting: Family Medicine

## 2019-07-13 NOTE — Telephone Encounter (Signed)
Attempted to contact patient # [phone] to inform him of sleep study results and inquire if he has a preference for DME provider for CPAP.  Voicemail full, will need to try again at a later tim.e

## 2019-07-14 ENCOUNTER — Telehealth: Payer: Self-pay

## 2019-07-14 NOTE — Telephone Encounter (Signed)
Call placed to patient and informed him of results of sleep study and the need for CPAP. He was in agreement and did not have a preference for home DME companies.  CPAP referral faxed to [facility].

## 2019-07-21 ENCOUNTER — Telehealth: Payer: Self-pay

## 2019-07-21 NOTE — Telephone Encounter (Signed)
Call placed to [facility], spoke to [place] who explained that they are in process of contacting the patient to schedule CPAP pick up.

## 2019-07-24 ENCOUNTER — Other Ambulatory Visit: Payer: Self-pay | Admitting: Family Medicine

## 2019-07-27 ENCOUNTER — Encounter: Payer: Self-pay | Admitting: Family Medicine

## 2019-07-27 ENCOUNTER — Other Ambulatory Visit: Payer: Self-pay | Admitting: Family Medicine

## 2019-07-27 ENCOUNTER — Ambulatory Visit: Payer: BC Managed Care – PPO

## 2019-07-27 ENCOUNTER — Other Ambulatory Visit: Payer: Self-pay

## 2019-07-27 VITALS — BP 130/85 | HR 84 | Temp 97.5°F

## 2019-07-27 DIAGNOSIS — I10 Essential (primary) hypertension: Secondary | ICD-10-CM

## 2019-07-27 NOTE — Progress Notes (Signed)
Patient here for BP check. After sitting BP was 130/85, pulse 84. Patient sent to checkout to make 3 month appointment for DM/BP/Chol follow up.

## 2019-07-27 NOTE — Progress Notes (Signed)
Patient ID: Ernest Austin, male   DOB: 1985-12-24, 33 y.o.   MRN: [id number]   Patient requested note per phone message regarding his diagnoses of diabetes and hypertension placing him at increased risk of COVID-19

## 2019-08-13 ENCOUNTER — Telehealth: Payer: Self-pay

## 2019-08-13 NOTE — Telephone Encounter (Signed)
Call received from [facility]. She stated that they patient was a no show for his appointment to pick up his CPAP.  They contacted him and rescheduled pick up for tomorrow - 08/14/2019 @ 1000.

## 2019-08-13 NOTE — Telephone Encounter (Signed)
Call placed to [facility] to check on status of CPAP delivery.  Spoke to [employer] who stated that she will need to check with respiratory team to confirm order status.

## 2019-08-14 ENCOUNTER — Other Ambulatory Visit: Payer: Self-pay | Admitting: Family Medicine

## 2019-08-14 NOTE — Telephone Encounter (Signed)
Requested medication (s) are due for refill today: yes  Requested medication (s) are on the active medication list: yes  Last refill:  07/06/2019  Future visit scheduled: yes  Notes to clinic:  Review for refill   Requested Prescriptions  Pending Prescriptions Disp Refills   Lancets (ONETOUCH DELICA PLUS [id number]) [place] [Pharmacy Med Name: ONE TOUCH DELICA PLUS 50K LANC] 938 each 0    Sig: TEST 1-2 TIMES A DAY     There is no refill protocol information for this order

## 2019-08-24 ENCOUNTER — Other Ambulatory Visit: Payer: BC Managed Care – PPO

## 2019-09-07 ENCOUNTER — Telehealth: Payer: Self-pay

## 2019-09-07 NOTE — Telephone Encounter (Signed)
As per [facility], patient was set up with CPAP on 08/18/2019.

## 2019-09-25 ENCOUNTER — Telehealth: Payer: Self-pay

## 2019-09-25 NOTE — Telephone Encounter (Signed)
Called patient to do their pre-visit COVID screening.  Call went to voicemail, which is full. Unable to do prescreening.  

## 2019-09-28 ENCOUNTER — Ambulatory Visit: Payer: BC Managed Care – PPO

## 2020-01-12 ENCOUNTER — Telehealth: Payer: Self-pay

## 2020-01-12 NOTE — Telephone Encounter (Signed)
Called patient to do their pre-visit COVID screening.  Call went to voicemail. Unable to do prescreening.  

## 2020-01-13 ENCOUNTER — Ambulatory Visit: Payer: BC Managed Care – PPO | Admitting: Internal Medicine

## 2021-11-18 NOTE — Progress Notes (Signed)
Erroneous encounter

## 2021-11-21 ENCOUNTER — Encounter: Payer: Self-pay | Admitting: Family

## 2021-11-21 DIAGNOSIS — Z13220 Encounter for screening for lipoid disorders: Secondary | ICD-10-CM

## 2021-11-21 DIAGNOSIS — Z13228 Encounter for screening for other metabolic disorders: Secondary | ICD-10-CM

## 2021-11-21 DIAGNOSIS — Z130 Encounter for screening for diseases of the blood and blood-forming organs and certain disorders involving the immune mechanism: Secondary | ICD-10-CM

## 2021-11-21 DIAGNOSIS — Z1329 Encounter for screening for other suspected endocrine disorder: Secondary | ICD-10-CM

## 2021-11-21 DIAGNOSIS — Z0000 Encounter for general adult medical examination without abnormal findings: Secondary | ICD-10-CM

## 2021-11-21 DIAGNOSIS — Z131 Encounter for screening for diabetes mellitus: Secondary | ICD-10-CM

## 2021-11-21 DIAGNOSIS — Z1159 Encounter for screening for other viral diseases: Secondary | ICD-10-CM

## 2022-06-19 ENCOUNTER — Ambulatory Visit
Admission: EM | Admit: 2022-06-19 | Discharge: 2022-06-19 | Disposition: A | Discharge status: Home / Self Care | Payer: BC Managed Care – PPO | Attending: Internal Medicine | Admitting: Internal Medicine

## 2022-06-19 DIAGNOSIS — H65192 Other acute nonsuppurative otitis media, left ear: Secondary | ICD-10-CM

## 2022-06-19 DIAGNOSIS — J069 Acute upper respiratory infection, unspecified: Secondary | ICD-10-CM

## 2022-06-19 DIAGNOSIS — B9689 Other specified bacterial agents as the cause of diseases classified elsewhere: Secondary | ICD-10-CM

## 2022-06-19 DIAGNOSIS — H109 Unspecified conjunctivitis: Secondary | ICD-10-CM

## 2022-06-19 MED ORDER — ALBUTEROL SULFATE HFA 108 (90 BASE) MCG/ACT IN AERS: 1.0000 | INHALATION_SPRAY | Freq: Four times a day (QID) | RESPIRATORY_TRACT | 0 refills | Status: DC | PRN

## 2022-06-19 MED ORDER — AMOXICILLIN 875 MG PO TABS: 875.0000 mg | ORAL_TABLET | Freq: Two times a day (BID) | ORAL | 0 refills | Status: AC

## 2022-06-19 MED ORDER — ERYTHROMYCIN 5 MG/GM OP OINT: TOPICAL_OINTMENT | OPHTHALMIC | 0 refills | Status: DC

## 2022-06-19 MED ORDER — BENZONATATE 100 MG PO CAPS: 100.0000 mg | ORAL_CAPSULE | Freq: Three times a day (TID) | ORAL | 0 refills | Status: DC | PRN

## 2022-06-19 MED ORDER — FLUTICASONE PROPIONATE 50 MCG/ACT NA SUSP: 1.0000 | Freq: Every day | NASAL | 0 refills | Status: DC

## 2022-06-19 NOTE — ED Provider Notes (Signed)
[facility]    CSN: [id number] Arrival date & time: 06/19/22  1652      History   Chief Complaint Chief Complaint  Patient presents with   URI   Eye Problem    HPI Ernest Austin is a 36 y.o. male.   Patient presents with bilateral eye irritation with drainage and crustiness, nasal congestion, runny nose, cough that has been present for about 2 to 3 days.  Denies any obvious trauma or foreign body to the eyes.  Patient does not wear contacts but does wear glasses.  Denies blurry vision.  Upper respiratory symptoms and cough have been present for about 3 days.  Denies any known fevers or sick contacts.  Denies chest pain, sore throat, ear pain, nausea, vomiting, diarrhea, abdominal pain.  Patient reports 1 episode of shortness of breath when symptoms first started after having a heavy coughing fit.  Patient used albuterol inhaler that belonged to his child with resolution.  Denies history of asthma or COPD  but patient does have a history of smoking.   URI Eye Problem   History reviewed. No pertinent past medical history.  Patient Active Problem List   Diagnosis Date Noted   Witnessed episode of apnea 07/05/2019    Past Surgical History:  Procedure Laterality Date   NO PAST SURGERIES         Home Medications    Prior to Admission medications   Medication Sig Start Date End Date Taking? Authorizing Provider  albuterol (VENTOLIN HFA) 108 (90 Base) MCG/ACT inhaler Inhale 1-2 puffs into the lungs every 6 (six) hours as needed for wheezing or shortness of breath. 06/19/22  Yes Kaitlynn Tramontana, Michele Rockers, FNP  amoxicillin (AMOXIL) 875 MG tablet Take 1 tablet (875 mg total) by mouth 2 (two) times daily for 10 days. 06/19/22 06/29/22 Yes Gurvir Schrom, Michele Rockers, FNP  benzonatate (TESSALON) 100 MG capsule Take 1 capsule (100 mg total) by mouth every 8 (eight) hours as needed for cough. 06/19/22  Yes Darah Simkin, Hildred Alamin E, FNP  erythromycin ophthalmic ointment Place a 1/2 inch ribbon of  ointment into the lower eyelid 4 times daily for 7 days. 06/19/22  Yes Jaxan Michel, Michele Rockers, FNP  fluticasone (FLONASE) 50 MCG/ACT nasal spray Place 1 spray into both nostrils daily for 3 days. 06/19/22 06/22/22 Yes Keana Dueitt, Michele Rockers, FNP  atorvastatin (LIPITOR) 20 MG tablet Take 1 tablet (20 mg total) by mouth daily. 06/30/19   Scot Jun, FNP  blood glucose meter kit and supplies KIT Dispense based on patient and insurance preference. Use up to twice daily. (FOR ICD-10 E.11.89). 06/30/19   Scot Jun, FNP  Lancets [facility] DELICA PLUS [id number]) MISC TEST 1-2 TIMES A DAY 08/16/19   Ladell Pier, MD  losartan (COZAAR) 25 MG tablet Take 1 tablet (25 mg total) by mouth daily. 06/30/19   Scot Jun, FNP  metFORMIN (GLUCOPHAGE) 500 MG tablet Take 1 tablet (500 mg total) by mouth 2 (two) times daily with a meal. 06/30/19   Scot Jun, FNP  [facility] VERIO test strip TEST 1-2 TIMES A DAY 07/24/19   Charlott Rakes, MD    Family History Family History  Problem Relation Age of Onset   Diabetes Mother    Hypertension Father    Heart attack Neg Hx    Stroke Neg Hx     Social History Social History   Tobacco Use   Smoking status: Former    Types: Cigarettes    Quit  date: 2016    Years since quitting: 7.5   Smokeless tobacco: Never     Allergies   Patient has no known allergies.   Review of Systems Review of Systems Per HPI  Physical Exam Triage Vital Signs ED Triage Vitals  Enc Vitals Group     BP 06/19/22 1725 95/66     Pulse Rate 06/19/22 1725 (!) 102     Resp 06/19/22 1725 17     Temp 06/19/22 1725 98 F (36.7 C)     Temp Source 06/19/22 1725 Oral     SpO2 06/19/22 1725 94 %     Weight --      Height --      Head Circumference --      Peak Flow --      Pain Score 06/19/22 1724 4     Pain Loc --      Pain Edu? --      Excl. in [place]? --    No data found.  Updated Vital Signs BP 95/66 (BP Location: Left Arm)   Pulse (!) 102   Temp 98 F (36.7 C)  (Oral)   Resp 17   SpO2 97%   Visual Acuity Right Eye Distance:   Left Eye Distance:   Bilateral Distance:    Right Eye Near:   Left Eye Near:    Bilateral Near:     Physical Exam Constitutional:      General: He is not in acute distress.    Appearance: Normal appearance. He is not toxic-appearing or diaphoretic.  HENT:     Head: Normocephalic and atraumatic.     Right Ear: Tympanic membrane and ear canal normal.     Left Ear: Ear canal normal. Tympanic membrane is erythematous. Tympanic membrane is not perforated or bulging.     Nose: Congestion present.     Mouth/Throat:     Mouth: Mucous membranes are moist.     Pharynx: No posterior oropharyngeal erythema.  Eyes:     General: Lids are normal. Lids are everted, no foreign bodies appreciated. Vision grossly intact. Gaze aligned appropriately.     Extraocular Movements: Extraocular movements intact.     Conjunctiva/sclera:     Right eye: Right conjunctiva is injected. Exudate present. No chemosis or hemorrhage.    Left eye: Left conjunctiva is injected. No chemosis, exudate or hemorrhage.    Pupils: Pupils are equal, round, and reactive to light.  Cardiovascular:     Rate and Rhythm: Normal rate and regular rhythm.     Pulses: Normal pulses.     Heart sounds: Normal heart sounds.  Pulmonary:     Effort: Pulmonary effort is normal. No respiratory distress.     Breath sounds: Normal breath sounds. No stridor. No wheezing, rhonchi or rales.  Abdominal:     General: Abdomen is flat. Bowel sounds are normal.     Palpations: Abdomen is soft.  Musculoskeletal:        General: Normal range of motion.     Cervical back: Normal range of motion.  Skin:    General: Skin is warm and dry.  Neurological:     General: No focal deficit present.     Mental Status: He is alert and oriented to person, place, and time. Mental status is at baseline.  Psychiatric:        Mood and Affect: Mood normal.        Behavior: Behavior normal.       UC Treatments /  Results  Labs (all labs ordered are listed, but only abnormal results are displayed) Labs Reviewed  NOVEL CORONAVIRUS, NAA    EKG   Radiology No results found.  Procedures Procedures (including critical care time)  Medications Ordered in UC Medications - No data to display  Initial Impression / Assessment and Plan / UC Course  I have reviewed the triage vital signs and the nursing notes.  Pertinent labs & imaging results that were available during my care of the patient were reviewed by me and considered in my medical decision making (see chart for details).     Patient presents with symptoms likely from a viral upper respiratory infection. Differential includes bacterial pneumonia, sinusitis, allergic rhinitis, COVID-19, flu. Do not suspect underlying cardiopulmonary process. Symptoms seem unlikely related to ACS, CHF or COPD exacerbations, pneumonia, pneumothorax. Patient is nontoxic appearing and not in need of emergent medical intervention.  COVID test pending.  Do not think that chest imaging is necessary given no current shortness of breath, signs of respiratory compromise, adventitious lung sounds on exam.  Amoxicillin to treat left ear infection.  Erythromycin to treat bilateral bacterial conjunctivitis.  Patient sent prescriptions to help alleviate viral upper respiratory symptoms.  Return if symptoms fail to improve in 1-2 weeks or you develop shortness of breath, chest pain, severe headache. Patient states understanding and is agreeable.  Discharged with PCP followup.  Final Clinical Impressions(s) / UC Diagnoses   Final diagnoses:  Other non-recurrent acute nonsuppurative otitis media of left ear  Bacterial conjunctivitis of both eyes  Viral upper respiratory tract infection with cough     Discharge Instructions      You have a viral upper respiratory infection that should run its course and self resolve with symptomatic treatment.   You also pinkeye which is being treated with antibiotic ointment.  please change pillowcase and linen daily.  You have an ear infection which is being treated with antibiotic.  You have been prescribed other medications to help alleviate symptoms as well and albuterol inhaler to use as needed.  Please follow-up if symptoms persist or worsen.  COVID test is pending.  We will call if it is positive.    ED Prescriptions     Medication Sig Dispense Auth. Provider   amoxicillin (AMOXIL) 875 MG tablet Take 1 tablet (875 mg total) by mouth 2 (two) times daily for 10 days. 20 tablet [place], [place] E, [place]   fluticasone [facility]) 50 MCG/ACT nasal spray Place 1 spray into both nostrils daily for 3 days. 16 g Ettie Krontz, Hildred Alamin E, [place]   benzonatate (TESSALON) 100 MG capsule Take 1 capsule (100 mg total) by mouth every 8 (eight) hours as needed for cough. 21 capsule [place], [place] E, [place]   albuterol (VENTOLIN HFA) 108 (90 Base) MCG/ACT inhaler Inhale 1-2 puffs into the lungs every 6 (six) hours as needed for wheezing or shortness of breath. 1 each Oswaldo Conroy E, [place]   erythromycin ophthalmic ointment Place a 1/2 inch ribbon of ointment into the lower eyelid 4 times daily for 7 days. 3.5 g Teodora Medici, [place]      PDMP not reviewed this encounter.   Teodora Medici, [place] 06/19/22 [id number]

## 2022-06-19 NOTE — ED Triage Notes (Signed)
Pt presents with bilateral eye drainage and crustiness; pt also complains of non productive cough that causes bilateral flank pain X 3 days.

## 2022-06-19 NOTE — Discharge Instructions (Addendum)
You have a viral upper respiratory infection that should run its course and self resolve with symptomatic treatment.  You also pinkeye which is being treated with antibiotic ointment.  please change pillowcase and linen daily.  You have an ear infection which is being treated with antibiotic.  You have been prescribed other medications to help alleviate symptoms as well and albuterol inhaler to use as needed.  Please follow-up if symptoms persist or worsen.  COVID test is pending.  We will call if it is positive.

## 2022-06-20 LAB — NOVEL CORONAVIRUS, NAA: SARS-CoV-2, NAA: NOT DETECTED

## 2022-06-23 ENCOUNTER — Emergency Department (HOSPITAL_BASED_OUTPATIENT_CLINIC_OR_DEPARTMENT_OTHER): Payer: BC Managed Care – PPO

## 2022-06-23 ENCOUNTER — Encounter (HOSPITAL_BASED_OUTPATIENT_CLINIC_OR_DEPARTMENT_OTHER): Payer: Self-pay | Admitting: Emergency Medicine

## 2022-06-23 ENCOUNTER — Other Ambulatory Visit: Payer: Self-pay

## 2022-06-23 ENCOUNTER — Emergency Department (HOSPITAL_BASED_OUTPATIENT_CLINIC_OR_DEPARTMENT_OTHER)
Admission: EM | Admit: 2022-06-23 | Discharge: 2022-06-23 | Disposition: A | Discharge status: Home / Self Care | Payer: BC Managed Care – PPO | Attending: Emergency Medicine | Admitting: Emergency Medicine

## 2022-06-23 DIAGNOSIS — R051 Acute cough: Secondary | ICD-10-CM

## 2022-06-23 DIAGNOSIS — R0602 Shortness of breath: Secondary | ICD-10-CM | POA: Diagnosis not present

## 2022-06-23 DIAGNOSIS — Z7951 Long term (current) use of inhaled steroids: Secondary | ICD-10-CM | POA: Insufficient documentation

## 2022-06-23 DIAGNOSIS — R059 Cough, unspecified: Secondary | ICD-10-CM | POA: Diagnosis present

## 2022-06-23 DIAGNOSIS — Z7984 Long term (current) use of oral hypoglycemic drugs: Secondary | ICD-10-CM | POA: Insufficient documentation

## 2022-06-23 HISTORY — DX: Type 2 diabetes mellitus without complications: E11.9

## 2022-06-23 MED ORDER — IPRATROPIUM-ALBUTEROL 0.5-2.5 (3) MG/3ML IN SOLN
RESPIRATORY_TRACT | Status: AC
  Filled 2022-06-23: qty 3

## 2022-06-23 MED ORDER — BENZONATATE 100 MG PO CAPS
200.0000 mg | ORAL_CAPSULE | Freq: Once | ORAL | Status: AC
  Administered 2022-06-23: 200 mg via ORAL
  Filled 2022-06-23: qty 2

## 2022-06-23 MED ORDER — IPRATROPIUM-ALBUTEROL 0.5-2.5 (3) MG/3ML IN SOLN
3.0000 mL | Freq: Once | RESPIRATORY_TRACT | Status: AC
  Administered 2022-06-23: 3 mL via RESPIRATORY_TRACT

## 2022-06-23 MED ORDER — IPRATROPIUM-ALBUTEROL 0.5-2.5 (3) MG/3ML IN SOLN
3.0000 mL | Freq: Once | RESPIRATORY_TRACT | Status: AC
  Administered 2022-06-23: 3 mL via RESPIRATORY_TRACT
  Filled 2022-06-23: qty 3

## 2022-06-23 MED ORDER — ALBUTEROL SULFATE (2.5 MG/3ML) 0.083% IN NEBU: 2.5000 mg | INHALATION_SOLUTION | Freq: Four times a day (QID) | RESPIRATORY_TRACT | 12 refills | Status: DC | PRN

## 2022-06-23 NOTE — ED Triage Notes (Signed)
Pt arrives pov, to triage in wheelchair, c/o shob x 1 week. Reports prod cough since last night.

## 2022-06-23 NOTE — ED Provider Notes (Signed)
[facility] EMERGENCY DEPARTMENT Provider Note   CSN: [id number] Arrival date & time: 06/23/22  1118     History Chief Complaint  Patient presents with   Cough    Ernest Austin is a 36 y.o. male patient who presents to the emerged from today with excessive coughing and shortness of breath.  Patient has been diagnosed with an upper respiratory infection and treated with amoxicillin over the last week or so.  Patient has been having some intermittent coughing spells which then resulted in him getting short of breath as he cannot catch his breath secondary to the coughing.  This is worse at night.  Patient used his albuterol inhaler that he was prescribed by his primary care doctor which did not offer much improvement.  However, his daughter has asthma and he used one of her breathing treatments with significantly helped.  He denies fever, chills, sore throat, abdominal pain, nausea, vomiting, diarrhea.   Cough      Home Medications Prior to Admission medications   Medication Sig Start Date End Date Taking? Authorizing Provider  albuterol (PROVENTIL) (2.5 MG/3ML) 0.083% nebulizer solution Take 3 mLs (2.5 mg total) by nebulization every 6 (six) hours as needed for wheezing or shortness of breath. 06/23/22  Yes Raul Del, Yaziel Brandon M, PA-C  albuterol (VENTOLIN HFA) 108 (90 Base) MCG/ACT inhaler Inhale 1-2 puffs into the lungs every 6 (six) hours as needed for wheezing or shortness of breath. 06/19/22   Teodora Medici, FNP  amoxicillin (AMOXIL) 875 MG tablet Take 1 tablet (875 mg total) by mouth 2 (two) times daily for 10 days. 06/19/22 06/29/22  Teodora Medici, FNP  atorvastatin (LIPITOR) 20 MG tablet Take 1 tablet (20 mg total) by mouth daily. 06/30/19   Scot Jun, FNP  benzonatate (TESSALON) 100 MG capsule Take 1 capsule (100 mg total) by mouth every 8 (eight) hours as needed for cough. 06/19/22   Teodora Medici, FNP  blood glucose meter kit and supplies KIT Dispense based on  patient and insurance preference. Use up to twice daily. (FOR ICD-10 E.11.89). 06/30/19   Scot Jun, FNP  erythromycin ophthalmic ointment Place a 1/2 inch ribbon of ointment into the lower eyelid 4 times daily for 7 days. 06/19/22   Teodora Medici, FNP  fluticasone (FLONASE) 50 MCG/ACT nasal spray Place 1 spray into both nostrils daily for 3 days. 06/19/22 06/22/22  Teodora Medici, FNP  Lancets (ONETOUCH DELICA PLUS [id number]) MISC TEST 1-2 TIMES A DAY 08/16/19   Ladell Pier, MD  losartan (COZAAR) 25 MG tablet Take 1 tablet (25 mg total) by mouth daily. 06/30/19   Scot Jun, FNP  metFORMIN (GLUCOPHAGE) 500 MG tablet Take 1 tablet (500 mg total) by mouth 2 (two) times daily with a meal. 06/30/19   Scot Jun, FNP  [facility] VERIO test strip TEST 1-2 TIMES A DAY 07/24/19   Charlott Rakes, MD      Allergies    Patient has no known allergies.    Review of Systems   Review of Systems  Respiratory:  Positive for cough.   All other systems reviewed and are negative.   Physical Exam Updated Vital Signs BP (!) 174/107   Pulse 98   Temp 99 F (37.2 C) (Oral)   Resp (!) 26   Ht 5' 1" (1.549 m)   Wt 123.8 kg   SpO2 93%   BMI 51.58 kg/m  Physical Exam Vitals and nursing note reviewed.  Constitutional:      General: He is not in acute distress.    Appearance: Normal appearance.  HENT:     Head: Normocephalic and atraumatic.  Eyes:     General:        Right eye: No discharge.        Left eye: No discharge.  Cardiovascular:     Comments: Regular rate and rhythm.  S1/S2 are distinct without any evidence of murmur, rubs, or gallops.  Radial pulses are 2+ bilaterally.  Dorsalis pedis pulses are 2+ bilaterally.  No evidence of pedal edema. Pulmonary:     Comments: Clear to auscultation bilaterally.  Normal effort.  No respiratory distress.  No evidence of wheezes, rales, or rhonchi heard throughout. Abdominal:     General: Abdomen is flat. Bowel sounds are normal.  There is no distension.     Tenderness: There is no abdominal tenderness. There is no guarding or rebound.  Musculoskeletal:        General: Normal range of motion.     Cervical back: Neck supple.  Skin:    General: Skin is warm and dry.     Findings: No rash.  Neurological:     General: No focal deficit present.     Mental Status: He is alert.  Psychiatric:        Mood and Affect: Mood normal.        Behavior: Behavior normal.     ED Results / Procedures / Treatments   Labs (all labs ordered are listed, but only abnormal results are displayed) Labs Reviewed - No data to display  EKG None  Radiology DG Chest 2 View  Result Date: 06/23/2022 CLINICAL DATA:  Cough, shortness of breath EXAM: CHEST - 2 VIEW COMPARISON:  01/05/2011 FINDINGS: Cardiac size is within normal limits. There are no signs of pulmonary edema. There is no focal pulmonary consolidation. Small linear densities are seen in medial right upper lung field. There is no pleural effusion or pneumothorax. IMPRESSION: There are no signs of pulmonary edema or focal pulmonary consolidation. Small linear densities in the medial right upper lung field suggest subsegmental atelectasis. Electronically Signed   By: Palani  Rathinasamy M.D.   On: 06/23/2022 12:02    Procedures Procedures    Medications Ordered in ED Medications  ipratropium-albuterol (DUONEB) 0.5-2.5 (3) MG/3ML nebulizer solution 3 mL (3 mLs Nebulization Given 06/23/22 1139)  ipratropium-albuterol (DUONEB) 0.5-2.5 (3) MG/3ML nebulizer solution 3 mL (3 mLs Nebulization Given 06/23/22 1446)  benzonatate (TESSALON) capsule 200 mg (200 mg Oral Given 06/23/22 1420)    ED Course/ Medical Decision Making/ A&P                           Medical Decision Making Ernest Austin is a 35 y.o. male patient who presents to the emergency department for further evaluation of excessive coughing and shortness of breath.  Patient coughing excessively on exam lungs were clear  to auscultation and I have a low suspicion for any focal consolidation.  Chest x-ray was done in triage and interpreted by myself.  Did not see any evidence of pneumonia.  I agree with the radiologist to rotation.  He also got a nebulizer treatment up in triage with significantly improved shortness of breath.  I will give him another nebulizer treatment and given Tessalon Perle for his coughing.  We will plan to reassess.  Intake vitals does show evidence of hypertension and tachycardia.  At   the time I interviewed the patient, his vital signs were normal.  He typically gets tachycardic when he has these coughing spells.  Patient feeling better after nebulizer treatments and Tessalon Perles.  Patient has Best boy at home.  Respiration rate has improved to 19 on reevaluation.  He is certainly breathing better.  I will send him home with nebulizer solution and have him follow-up with his primary care doctor.  He is safe for discharge at this time.  Strict return precautions were discussed.   Amount and/or Complexity of Data Reviewed Radiology: ordered.  Risk Prescription drug management.   Final Clinical Impression(s) / ED Diagnoses Final diagnoses:  Acute cough  Shortness of breath    Rx / DC Orders ED Discharge Orders          Ordered    albuterol (PROVENTIL) (2.5 MG/3ML) 0.083% nebulizer solution  Every 6 hours PRN        06/23/22 1531              Hendricks Limes, PA-C 06/23/22 1535    Hayden Rasmussen, MD 06/23/22 1844

## 2022-06-23 NOTE — Discharge Instructions (Addendum)
Please follow-up with your primary care doctor for further evaluation.  I am also going to send you home with nebulizer solution that you can use with your daughter's machine.  Please return to the emergency department for any worsening symptoms that you might have.

## 2023-03-22 ENCOUNTER — Ambulatory Visit: Payer: BC Managed Care – PPO | Admitting: Nurse Practitioner

## 2023-03-22 ENCOUNTER — Other Ambulatory Visit: Payer: Self-pay | Admitting: Nurse Practitioner

## 2023-03-22 VITALS — BP 164/88 | HR 105 | Temp 98.7°F | Ht 61.0 in | Wt 282.4 lb

## 2023-03-22 DIAGNOSIS — Z1159 Encounter for screening for other viral diseases: Secondary | ICD-10-CM | POA: Diagnosis not present

## 2023-03-22 DIAGNOSIS — E119 Type 2 diabetes mellitus without complications: Secondary | ICD-10-CM

## 2023-03-22 DIAGNOSIS — I10 Essential (primary) hypertension: Secondary | ICD-10-CM | POA: Diagnosis not present

## 2023-03-22 DIAGNOSIS — E785 Hyperlipidemia, unspecified: Secondary | ICD-10-CM

## 2023-03-22 LAB — CBC
HCT: 43.7 % (ref 39.0–52.0)
Hemoglobin: 14.6 g/dL (ref 13.0–17.0)
MCHC: 33.3 g/dL (ref 30.0–36.0)
MCV: 92.1 fl (ref 78.0–100.0)
Platelets: 305 10*3/uL (ref 150.0–400.0)
RBC: 4.74 Mil/uL (ref 4.22–5.81)
RDW: 14.3 % (ref 11.5–15.5)
WBC: 4 10*3/uL (ref 4.0–10.5)

## 2023-03-22 LAB — LIPID PANEL
Cholesterol: 236 mg/dL — ABNORMAL HIGH (ref 0–200)
HDL: 27.5 mg/dL — ABNORMAL LOW (ref >39.00)
LDL Cholesterol: 176 mg/dL — ABNORMAL HIGH (ref 0–99)
NonHDL: 208.47
Total CHOL/HDL Ratio: 9
Triglycerides: 161 mg/dL — ABNORMAL HIGH (ref 0.0–149.0)
VLDL: 32.2 mg/dL (ref 0.0–40.0)

## 2023-03-22 LAB — COMPREHENSIVE METABOLIC PANEL
ALT: 25 U/L (ref 0–53)
AST: 18 U/L (ref 0–37)
Albumin: 4.4 g/dL (ref 3.5–5.2)
Alkaline Phosphatase: 78 U/L (ref 39–117)
BUN: 7 mg/dL (ref 6–23)
CO2: 29 mEq/L (ref 19–32)
Calcium: 9.1 mg/dL (ref 8.4–10.5)
Chloride: 101 mEq/L (ref 96–112)
Creatinine, Ser: 1.12 mg/dL (ref 0.40–1.50)
GFR: 84.02 mL/min (ref >60.00)
Glucose, Bld: 265 mg/dL — ABNORMAL HIGH (ref 70–99)
Potassium: 3.7 mEq/L (ref 3.5–5.1)
Sodium: 139 mEq/L (ref 135–145)
Total Bilirubin: 0.4 mg/dL (ref 0.2–1.2)
Total Protein: 7.8 g/dL (ref 6.0–8.3)

## 2023-03-22 LAB — HEMOGLOBIN A1C: Hgb A1c MFr Bld: 7.1 % — ABNORMAL HIGH (ref 4.6–6.5)

## 2023-03-22 LAB — TSH: TSH: 1.64 u[IU]/mL (ref 0.35–5.50)

## 2023-03-22 LAB — MICROALBUMIN / CREATININE URINE RATIO
Creatinine,U: 144.9 mg/dL
Microalb Creat Ratio: 5.4 mg/g (ref 0.0–30.0)
Microalb, Ur: 7.8 mg/dL — ABNORMAL HIGH (ref 0.0–1.9)

## 2023-03-22 MED ORDER — LOSARTAN POTASSIUM 25 MG PO TABS
25.0000 mg | ORAL_TABLET | Freq: Every day | ORAL | 0 refills | Status: DC
Start: 2023-03-22 — End: 2023-04-05

## 2023-03-22 MED ORDER — METFORMIN HCL ER 500 MG PO TB24
500.0000 mg | ORAL_TABLET | Freq: Two times a day (BID) | ORAL | 3 refills | Status: DC
Start: 2023-03-22 — End: 2024-04-06

## 2023-03-22 NOTE — Assessment & Plan Note (Signed)
Labs ordered, further recommendations may be made based upon his results. 

## 2023-03-22 NOTE — Patient Instructions (Signed)
I will notify you regarding your lab results when they become available via Mychart. Because blood pressure is high today, monitor yourself for any chest pain, shortness of breath, visual changes, headache, stroke like symptoms (facial droop, sensory changes, weakness, difficulty talking/swallowing). If any of these occur proceed to the emergency department. Getting blood pressure better controlled will help reduce risk of heart attack/stroke. DASH diet can also help with blood pressure, I will attach information about this as well.

## 2023-03-22 NOTE — Assessment & Plan Note (Signed)
Chronic, check A1c today.  Pending lab results today will refill metformin but changed to extended release and recommend he take 500 mg by mouth twice a day.  Will also continue on Rybelsus 7mg /day.  Lipid panel ordered.  Anticipate starting patient on statin therapy, but will probably discuss this further at next office visit.  Anticipating restarting ARB based on lab results.  Will also check urine for evidence of albuminuria.  Patient encouraged to follow-up with eye doctor next month and to request eye exam records be forwarded to this office.

## 2023-03-22 NOTE — Progress Notes (Signed)
New Patient Office Visit  Subjective    Patient ID: Ernest Austin, male    DOB: 02-18-1986  Age: 37 y.o. MRN: [id number]  CC:  Chief Complaint  Patient presents with   New Patient (Initial Visit)    Wants to establish care, BP and cholesterol Taking metformin cause him to use the bathroom more( number two)     HPI Ernest Austin presents to establish care Was being seen at [facility] medical center.  Wishes to transfer care to this office at this time.  Reports he was diagnosed with type 2 diabetes in 2020, has eye exam scheduled for next month.  Currently on metformin 500 mg twice a day, however takes 1000 g at bedtime due to GI upset.  Also on Rybelsus 7 mg daily, has glucometer reports checking his blood sugars regularly but cannot recall current trends.  Has been prescribed losartan and atorvastatin in the past but tells me he is not taking the medication currently.  Last lab results available to me are from 2020.  Overall, he feels well and just wants to establish with a new primary care.  Has no acute concerns today.  Outpatient Encounter Medications as of 03/22/2023  Medication Sig   blood glucose meter kit and supplies KIT Dispense based on patient and insurance preference. Use up to twice daily. (FOR ICD-10 E.11.89).   Lancets (ONETOUCH DELICA PLUS LANCET30G) MISC TEST 1-2 TIMES A DAY   metFORMIN (GLUCOPHAGE) 500 MG tablet Take 1 tablet (500 mg total) by mouth 2 (two) times daily with a meal.   ONETOUCH VERIO test strip TEST 1-2 TIMES A DAY   Semaglutide (RYBELSUS) 7 MG TABS Take 1 tablet by mouth daily.   [DISCONTINUED] albuterol (VENTOLIN HFA) 108 (90 Base) MCG/ACT inhaler Inhale 1-2 puffs into the lungs every 6 (six) hours as needed for wheezing or shortness of breath.   losartan (COZAAR) 25 MG tablet Take 1 tablet (25 mg total) by mouth daily. (Patient not taking: Reported on 03/22/2023)   [DISCONTINUED] albuterol (PROVENTIL) (2.5 MG/3ML) 0.083% nebulizer solution  Take 3 mLs (2.5 mg total) by nebulization every 6 (six) hours as needed for wheezing or shortness of breath.   [DISCONTINUED] atorvastatin (LIPITOR) 20 MG tablet Take 1 tablet (20 mg total) by mouth daily.   [DISCONTINUED] benzonatate (TESSALON) 100 MG capsule Take 1 capsule (100 mg total) by mouth every 8 (eight) hours as needed for cough.   [DISCONTINUED] erythromycin ophthalmic ointment Place a 1/2 inch ribbon of ointment into the lower eyelid 4 times daily for 7 days.   [DISCONTINUED] fluticasone (FLONASE) 50 MCG/ACT nasal spray Place 1 spray into both nostrils daily for 3 days.   No facility-administered encounter medications on file as of 03/22/2023.    Past Medical History:  Diagnosis Date   Diabetes mellitus without complication (HCC)     Past Surgical History:  Procedure Laterality Date   NO PAST SURGERIES      Family History  Problem Relation Age of Onset   Diabetes Mother    Hypertension Father    Heart attack Neg Hx    Stroke Neg Hx     Social History   Socioeconomic History   Marital status: Single    Spouse name: Not on file   Number of children: Not on file   Years of education: Not on file   Highest education level: Not on file  Occupational History   Not on file  Tobacco Use   Smoking  status: Former    Types: Cigarettes    Quit date: 2016    Years since quitting: 8.2   Smokeless tobacco: Never  Substance and Sexual Activity   Alcohol use: Not Currently   Drug use: Never   Sexual activity: Not on file  Other Topics Concern   Not on file  Social History Narrative   Not on file   Social Determinants of Health   Financial Resource Strain: Not on file  Food Insecurity: Not on file  Transportation Needs: Not on file  Physical Activity: Not on file  Stress: Not on file  Social Connections: Not on file  Intimate Partner Violence: Not on file    Review of Systems  Eyes:  Negative for blurred vision.  Respiratory:  Negative for shortness of  breath.   Cardiovascular:  Negative for chest pain.  Neurological:  Negative for dizziness and headaches.        Objective    BP (!) 164/88   Pulse (!) 105   Temp 98.7 F (37.1 C) (Temporal)   Ht 5\' 1"  (1.549 m)   Wt 282 lb 6 oz (128.1 kg)   SpO2 95%   BMI 53.35 kg/m   Physical Exam Vitals reviewed.  Constitutional:      Appearance: Normal appearance.  HENT:     Head: Normocephalic and atraumatic.  Cardiovascular:     Rate and Rhythm: Normal rate and regular rhythm.  Pulmonary:     Effort: Pulmonary effort is normal.     Breath sounds: Normal breath sounds.  Musculoskeletal:     Cervical back: Neck supple.  Skin:    General: Skin is warm and dry.  Neurological:     Mental Status: He is alert and oriented to person, place, and time.  Psychiatric:        Mood and Affect: Mood normal.        Behavior: Behavior normal.        Thought Content: Thought content normal.        Judgment: Judgment normal.         Assessment & Plan:   Problem List Items Addressed This Visit       Cardiovascular and Mediastinum   HTN (hypertension) - Primary    Chronic, uncontrolled.   Asymptomatic at this time.  Will order stat metabolic panel assuming kidney function has remained stable we will represcribe losartan 25 mg/day, patient will follow-up in 2 weeks for close monitoring.   Handout provided regarding DASH diet, patient encouraged to review and make changes as able.   Also educated on signs and symptoms of heart attack and/or stroke and to proceed to the emergency department if these occur.  He reports his understanding.      Relevant Orders   CBC   Comprehensive metabolic panel     Endocrine   T2DM (type 2 diabetes mellitus)    Chronic, check A1c today.  Pending lab results today will refill metformin but changed to extended release and recommend he take 500 mg by mouth twice a day.  Will also continue on Rybelsus 7mg /day.  Lipid panel ordered.  Anticipate starting  patient on statin therapy, but will probably discuss this further at next office visit.  Anticipating restarting ARB based on lab results.  Will also check urine for evidence of albuminuria.  Patient encouraged to follow-up with eye doctor next month and to request eye exam records be forwarded to this office.      Relevant Medications  Semaglutide (RYBELSUS) 7 MG TABS   Other Relevant Orders   CBC   Comprehensive metabolic panel   Hemoglobin A1c   TSH   Microalbumin / creatinine urine ratio     Other   HLD (hyperlipidemia)    Labs ordered, further recommendations may be made based upon his results.      Relevant Orders   CBC   Comprehensive metabolic panel   Lipid panel   Encounter for hepatitis C screening test for low risk patient    Labs ordered, further recommendations may be made based upon his results.      Relevant Orders   Hepatitis C antibody    Return in about 2 weeks (around 04/05/2023) for F/U with Maralyn Sago .   Elenore Paddy, NP

## 2023-03-22 NOTE — Assessment & Plan Note (Addendum)
Chronic, uncontrolled.   Asymptomatic at this time.  Will order stat metabolic panel assuming kidney function has remained stable we will represcribe losartan 25 mg/day, patient will follow-up in 2 weeks for close monitoring.   Handout provided regarding DASH diet, patient encouraged to review and make changes as able.   Also educated on signs and symptoms of heart attack and/or stroke and to proceed to the emergency department if these occur.  He reports his understanding.

## 2023-03-23 LAB — HEPATITIS C ANTIBODY: Hepatitis C Ab: NONREACTIVE

## 2023-04-05 ENCOUNTER — Ambulatory Visit: Payer: BC Managed Care – PPO | Admitting: Nurse Practitioner

## 2023-04-05 VITALS — BP 168/110 | HR 76 | Temp 98.1°F | Ht 61.0 in | Wt 278.2 lb

## 2023-04-05 DIAGNOSIS — E559 Vitamin D deficiency, unspecified: Secondary | ICD-10-CM | POA: Diagnosis not present

## 2023-04-05 DIAGNOSIS — E785 Hyperlipidemia, unspecified: Secondary | ICD-10-CM | POA: Diagnosis not present

## 2023-04-05 DIAGNOSIS — E119 Type 2 diabetes mellitus without complications: Secondary | ICD-10-CM

## 2023-04-05 DIAGNOSIS — I10 Essential (primary) hypertension: Secondary | ICD-10-CM

## 2023-04-05 MED ORDER — TELMISARTAN 40 MG PO TABS
40.0000 mg | ORAL_TABLET | Freq: Every day | ORAL | 1 refills | Status: DC
Start: 2023-04-05 — End: 2023-04-19

## 2023-04-05 MED ORDER — ROSUVASTATIN CALCIUM 20 MG PO TABS
20.0000 mg | ORAL_TABLET | Freq: Every day | ORAL | 3 refills | Status: AC
Start: 2023-04-05 — End: ?

## 2023-04-05 MED ORDER — OZEMPIC (0.25 OR 0.5 MG/DOSE) 2 MG/3ML ~~LOC~~ SOPN
0.2500 mg | PEN_INJECTOR | SUBCUTANEOUS | 1 refills | Status: DC
Start: 2023-04-05 — End: 2023-04-19

## 2023-04-05 NOTE — Assessment & Plan Note (Signed)
Chronic LDL above goal of 70 Had risk versus benefit discussion regarding starting statin therapy.  Patient is agreeable to starting.  Rosuvastatin 20 mg by mouth at night sent to patient's pharmacy.  Patient also encouraged to follow Mediterranean diet.

## 2023-04-05 NOTE — Progress Notes (Signed)
Established Patient Office Visit  Subjective   Patient ID: Ernest Austin, male    DOB: 11-20-1986  Age: 37 y.o. MRN: [id number]  Chief Complaint  Patient presents with   Medical Management of Chronic Issues    Type 2 diabetes/hyperlipidemia/hypertension: A1c on last lab draw 7.1, patient currently on Rybelsus 7 mg daily and metformin 500 mg twice a day.  However he reports taking metformin 1000 mg at bedtime.  He was restarted on losartan at last office visit, reports tolerating this very well.  Not on statin therapy, LDL from last lab draw was176.    Review of Systems  Respiratory:  Negative for shortness of breath.   Cardiovascular:  Negative for chest pain.  Neurological:  Negative for headaches.      Objective:     BP (!) 168/110   Pulse 76   Temp 98.1 F (36.7 C) (Temporal)   Ht 5\' 1"  (1.549 m)   Wt 278 lb 4 oz (126.2 kg)   SpO2 96%   BMI 52.57 kg/m  BP Readings from Last 3 Encounters:  04/05/23 (!) 168/110  03/22/23 (!) 164/88  06/23/22 (!) 174/107   Wt Readings from Last 3 Encounters:  04/05/23 278 lb 4 oz (126.2 kg)  03/22/23 282 lb 6 oz (128.1 kg)  06/23/22 273 lb (123.8 kg)      Physical Exam Vitals reviewed.  Constitutional:      Appearance: Normal appearance.  HENT:     Head: Normocephalic and atraumatic.  Cardiovascular:     Rate and Rhythm: Normal rate and regular rhythm.  Pulmonary:     Effort: Pulmonary effort is normal.     Breath sounds: Normal breath sounds.  Musculoskeletal:     Cervical back: Neck supple.  Skin:    General: Skin is warm and dry.  Neurological:     Mental Status: He is alert and oriented to person, place, and time.  Psychiatric:        Mood and Affect: Mood normal.        Behavior: Behavior normal.        Thought Content: Thought content normal.        Judgment: Judgment normal.      No results found for any visits on 04/05/23.    The ASCVD Risk score (Arnett DK, et al., 2019) failed to calculate  for the following reasons:   The 2019 ASCVD risk score is only valid for ages 35 to 76    Assessment & Plan:   Problem List Items Addressed This Visit       Cardiovascular and Mediastinum   HTN (hypertension) - Primary    Chronic, blood pressure still above goal. Per shared decision making we will change from losartan to telmisartan 40 mg by mouth daily Check BMP, patient to return to office in 2 weeks      Relevant Medications   telmisartan (MICARDIS) 40 MG tablet   rosuvastatin (CRESTOR) 20 MG tablet   Other Relevant Orders   Basic metabolic panel     Endocrine   T2DM (type 2 diabetes mellitus) (HCC)    Chronic A1c slightly above goal of 7, (A1c 7.1 in 03/2023) Patient on ARB, transitioning from losartan to telmisartan today Per shared decision making we will discontinue Rybelsus and start patient on Ozempic 0.25 mg weekly and patient will continue on metformin 1000 mg daily Also starting patient on statin therapy, rosuvastatin 20 mg daily Patient to follow-up in 2 weeks or sooner  as needed      Relevant Medications   telmisartan (MICARDIS) 40 MG tablet   rosuvastatin (CRESTOR) 20 MG tablet   Semaglutide,0.25 or 0.5MG /DOS, (OZEMPIC, 0.25 OR 0.5 MG/DOSE,) 2 MG/3ML SOPN     Other   HLD (hyperlipidemia)    Chronic LDL above goal of 70 Had risk versus benefit discussion regarding starting statin therapy.  Patient is agreeable to starting.  Rosuvastatin 20 mg by mouth at night sent to patient's pharmacy.  Patient also encouraged to follow Mediterranean diet.      Relevant Medications   telmisartan (MICARDIS) 40 MG tablet   rosuvastatin (CRESTOR) 20 MG tablet   Vitamin D deficiency    Patient reports he has a history of this, not currently on supplementation.  Check serum level, Further recommendations may be made based on these results.        Relevant Orders   VITAMIN D 25 Hydroxy (Vit-D Deficiency, Fractures)    Return in about 2 weeks (around 04/19/2023) for F/U  with Clarivel Callaway (BP, vitamin D).    Elenore Paddy, NP

## 2023-04-05 NOTE — Assessment & Plan Note (Signed)
Chronic A1c slightly above goal of 7, (A1c 7.1 in 03/2023) Patient on ARB, transitioning from losartan to telmisartan today Per shared decision making we will discontinue Rybelsus and start patient on Ozempic 0.25 mg weekly and patient will continue on metformin 1000 mg daily Also starting patient on statin therapy, rosuvastatin 20 mg daily Patient to follow-up in 2 weeks or sooner as needed

## 2023-04-05 NOTE — Assessment & Plan Note (Signed)
Patient reports he has a history of this, not currently on supplementation.  Check serum level, Further recommendations may be made based on these results.

## 2023-04-05 NOTE — Assessment & Plan Note (Signed)
Chronic, blood pressure still above goal. Per shared decision making we will change from losartan to telmisartan 40 mg by mouth daily Check BMP, patient to return to office in 2 weeks

## 2023-04-12 ENCOUNTER — Ambulatory Visit: Payer: BC Managed Care – PPO | Admitting: Nurse Practitioner

## 2023-04-19 ENCOUNTER — Ambulatory Visit: Payer: BC Managed Care – PPO | Admitting: Nurse Practitioner

## 2023-04-19 VITALS — BP 172/104 | HR 95 | Temp 98.0°F | Ht 61.0 in | Wt 219.0 lb

## 2023-04-19 DIAGNOSIS — I10 Essential (primary) hypertension: Secondary | ICD-10-CM | POA: Diagnosis not present

## 2023-04-19 DIAGNOSIS — Z7985 Long-term (current) use of injectable non-insulin antidiabetic drugs: Secondary | ICD-10-CM

## 2023-04-19 DIAGNOSIS — E785 Hyperlipidemia, unspecified: Secondary | ICD-10-CM

## 2023-04-19 DIAGNOSIS — E119 Type 2 diabetes mellitus without complications: Secondary | ICD-10-CM | POA: Diagnosis not present

## 2023-04-19 DIAGNOSIS — Z7984 Long term (current) use of oral hypoglycemic drugs: Secondary | ICD-10-CM

## 2023-04-19 DIAGNOSIS — E559 Vitamin D deficiency, unspecified: Secondary | ICD-10-CM

## 2023-04-19 LAB — VITAMIN D 25 HYDROXY (VIT D DEFICIENCY, FRACTURES): VITD: 28.8 ng/mL — ABNORMAL LOW (ref 30.00–100.00)

## 2023-04-19 LAB — BASIC METABOLIC PANEL
BUN: 8 mg/dL (ref 6–23)
CO2: 31 mEq/L (ref 19–32)
Calcium: 9.7 mg/dL (ref 8.4–10.5)
Chloride: 101 mEq/L (ref 96–112)
Creatinine, Ser: 1.05 mg/dL (ref 0.40–1.50)
GFR: 90.73 mL/min (ref >60.00)
Glucose, Bld: 81 mg/dL (ref 70–99)
Potassium: 4.4 mEq/L (ref 3.5–5.1)
Sodium: 140 mEq/L (ref 135–145)

## 2023-04-19 MED ORDER — TELMISARTAN 80 MG PO TABS
80.0000 mg | ORAL_TABLET | Freq: Every day | ORAL | 1 refills | Status: DC
Start: 2023-04-19 — End: 2024-03-09

## 2023-04-19 MED ORDER — OZEMPIC (0.25 OR 0.5 MG/DOSE) 2 MG/3ML ~~LOC~~ SOPN
0.5000 mg | PEN_INJECTOR | SUBCUTANEOUS | 1 refills | Status: DC
Start: 2023-04-19 — End: 2023-07-12

## 2023-04-19 NOTE — Assessment & Plan Note (Signed)
Chronic Continue metformin 5 mg twice a day.  After completing 2 more doses of Ozempic 0.25 mg, will plan to increase Ozempic to 0.5 mg weekly Continue ARB and statin

## 2023-04-19 NOTE — Assessment & Plan Note (Signed)
Chronic Continue rosuvastatin 20 mg daily 

## 2023-04-19 NOTE — Progress Notes (Signed)
Established Patient Office Visit  Subjective   Patient ID: Ernest Austin, male    DOB: [birth year]  Age: 37 y.o. MRN: [id number]  Chief Complaint  Patient presents with   Hypertension    T2DM/HTN/HLD: Last A1c 7.1.  Stopped Rybelsus at last office visit started Ozempic.  Has completed 2 doses of Ozempic.  Tolerating well.  Also on metformin 500 mg twice a day continues to tolerate well.  We changed to telmisartan 40 mg daily at last office visit, he is tolerating this well.  Here for blood pressure check and to check metabolic panel.  Continues to deny any headache, chest pain, shortness of breath.  Start rosuvastatin at last office visit as well, so far tolerating well.      Review of Systems  Eyes:  Negative for blurred vision.  Respiratory:  Negative for shortness of breath.   Cardiovascular:  Negative for chest pain.  Neurological:  Negative for headaches.      Objective:     BP (!) 172/104   Pulse 95   Temp 98 F (36.7 C) (Temporal)   Ht 5\' 1"  (1.549 m)   Wt 219 lb (99.3 kg)   SpO2 98%   BMI 41.38 kg/m  BP Readings from Last 3 Encounters:  04/19/23 (!) 172/104  04/05/23 (!) 168/110  03/22/23 (!) 164/88   Wt Readings from Last 3 Encounters:  04/19/23 219 lb (99.3 kg)  04/05/23 278 lb 4 oz (126.2 kg)  03/22/23 282 lb 6 oz (128.1 kg)      Physical Exam Vitals reviewed.  Constitutional:      Appearance: Normal appearance.  HENT:     Head: Normocephalic and atraumatic.  Cardiovascular:     Rate and Rhythm: Normal rate and regular rhythm.  Pulmonary:     Effort: Pulmonary effort is normal.     Breath sounds: Normal breath sounds.  Musculoskeletal:     Cervical back: Neck supple.  Skin:    General: Skin is warm and dry.  Neurological:     Mental Status: He is alert and oriented to person, place, and time.  Psychiatric:        Mood and Affect: Mood normal.        Behavior: Behavior normal.        Thought Content: Thought content normal.         Judgment: Judgment normal.      No results found for any visits on 04/19/23.    The ASCVD Risk score (Arnett DK, et al., 2019) failed to calculate for the following reasons:   The 2019 ASCVD risk score is only valid for ages 28 to 38    Assessment & Plan:   Problem List Items Addressed This Visit       Cardiovascular and Mediastinum   HTN (hypertension) - Primary    Chronic Blood pressure well above goal today Per shared decision making will increase telmisartan to 80 mg daily, check metabolic panel today.  Further recommendations may be made based upon these results. Patient to follow-up in 1-2 weeks for blood pressure check as well as to recheck metabolic panel If blood pressure still remains above goal anticipate considering adding amlodipine to medication regimen      Relevant Medications   telmisartan (MICARDIS) 80 MG tablet     Endocrine   T2DM (type 2 diabetes mellitus) (HCC)    Chronic Continue metformin 5 mg twice a day.  After completing 2 more doses of Ozempic 0.25  mg, will plan to increase Ozempic to 0.5 mg weekly Continue ARB and statin      Relevant Medications   telmisartan (MICARDIS) 80 MG tablet   Semaglutide,0.25 or 0.5MG /DOS, (OZEMPIC, 0.25 OR 0.5 MG/DOSE,) 2 MG/3ML SOPN     Other   HLD (hyperlipidemia)    Chronic Continue rosuvastatin 20 mg daily      Relevant Medications   telmisartan (MICARDIS) 80 MG tablet    Return in about 2 weeks (around 05/03/2023) for CPE with Wilgus Deyton, HTN, T2DM.    Elenore Paddy, NP

## 2023-04-19 NOTE — Assessment & Plan Note (Signed)
Chronic Blood pressure well above goal today Per shared decision making will increase telmisartan to 80 mg daily, check metabolic panel today.  Further recommendations may be made based upon these results. Patient to follow-up in 1-2 weeks for blood pressure check as well as to recheck metabolic panel If blood pressure still remains above goal anticipate considering adding amlodipine to medication regimen

## 2023-04-29 ENCOUNTER — Ambulatory Visit: Payer: BC Managed Care – PPO | Admitting: Family Medicine

## 2023-04-29 ENCOUNTER — Encounter: Payer: Self-pay | Admitting: Family Medicine

## 2023-04-29 VITALS — BP 144/102 | HR 82 | Temp 98.0°F | Resp 20 | Ht 61.0 in | Wt 279.0 lb

## 2023-04-29 DIAGNOSIS — I10 Essential (primary) hypertension: Secondary | ICD-10-CM | POA: Diagnosis not present

## 2023-04-29 MED ORDER — AMLODIPINE BESYLATE 5 MG PO TABS
5.0000 mg | ORAL_TABLET | Freq: Every day | ORAL | 2 refills | Status: DC
Start: 2023-04-29 — End: 2023-06-14

## 2023-04-29 NOTE — Assessment & Plan Note (Signed)
Uncontrolled, but improving. Continue Telmisartan 80 mg once daily. Add Amlodipine 5 mg once daily. Discussed once we know if he will need the 5 mg to 10 mg dosage, we can prescribe a combination Telmisartan-Amlodipine so he only has to take one medication.  Encouraged to continue exercising.  Discussed goal of 30 minutes 5 days/week.  Education provided on the DASH diet.

## 2023-04-29 NOTE — Progress Notes (Signed)
Assessment & Plan:  Hypertension, unspecified type Assessment & Plan: Uncontrolled, but improving. Continue Telmisartan 80 mg once daily. Add Amlodipine 5 mg once daily. Discussed once we know if he will need the 5 mg to 10 mg dosage, we can prescribe a combination Telmisartan-Amlodipine so he only has to take one medication.  Encouraged to continue exercising.  Discussed goal of 30 minutes 5 days/week.  Education provided on the DASH diet.  Orders: -     amLODIPine Besylate; Take 1 tablet (5 mg total) by mouth daily.  Dispense: 30 tablet; Refill: 2     Return in about 2 weeks (around 05/13/2023) for HTN with PCP.  Deliah Boston, MSN, APRN, FNP-C  Subjective:      HPI: Ernest Austin is a 37 y.o. male presenting on 04/29/2023 for Hypertension (10 day follow up of new med dose. )  Patient is here to follow-up on hypertension. He was last seen on 04/19/23 at which time his telmisartan was increased from 40 mg to 80 mg daily. He takes this at night. He has been monitoring his blood pressure and reports readings 142-160/100-102, which is an improvement. He is walking three days a week. He does eat a low-salt diet.    New complaints: None   Social history:  Relevant past medical, surgical, family and social history reviewed and updated as indicated. Interim medical history since our last visit reviewed.  Allergies and medications reviewed and updated.  DATA REVIEWED: CHART IN EPIC  ROS: Negative unless specifically indicated above in HPI.    Current Outpatient Medications:    blood glucose meter kit and supplies KIT, Dispense based on patient and insurance preference. Use up to twice daily. (FOR ICD-10 E.11.89)., Disp: 1 each, Rfl: 0   Lancets (ONETOUCH DELICA PLUS LANCET30G) MISC, TEST 1-2 TIMES A DAY, Disp: 100 each, Rfl: 2   metFORMIN (GLUCOPHAGE-XR) 500 MG 24 hr tablet, Take 1 tablet (500 mg total) by mouth 2 (two) times daily with a meal., Disp: 180 tablet, Rfl: 3    ONETOUCH VERIO test strip, TEST 1-2 TIMES A DAY, Disp: 50 strip, Rfl: 0   rosuvastatin (CRESTOR) 20 MG tablet, Take 1 tablet (20 mg total) by mouth daily., Disp: 90 tablet, Rfl: 3   Semaglutide,0.25 or 0.5MG /DOS, (OZEMPIC, 0.25 OR 0.5 MG/DOSE,) 2 MG/3ML SOPN, Inject 0.5 mg into the skin once a week., Disp: 3 mL, Rfl: 1   telmisartan (MICARDIS) 80 MG tablet, Take 1 tablet (80 mg total) by mouth daily., Disp: 90 tablet, Rfl: 1      Objective:    BP (!) 144/102   Pulse 82   Temp 98 F (36.7 C)   Resp 20   Ht 5\' 1"  (1.549 m)   Wt 279 lb (126.6 kg)   BMI 52.72 kg/m   Wt Readings from Last 3 Encounters:  04/29/23 279 lb (126.6 kg)  04/19/23 219 lb (99.3 kg)  04/05/23 278 lb 4 oz (126.2 kg)    Physical Exam Vitals reviewed.  Constitutional:      General: He is not in acute distress.    Appearance: Normal appearance. He is morbidly obese. He is not ill-appearing, toxic-appearing or diaphoretic.  HENT:     Head: Normocephalic and atraumatic.  Eyes:     General: No scleral icterus.       Right eye: No discharge.        Left eye: No discharge.     Conjunctiva/sclera: Conjunctivae normal.  Cardiovascular:  Rate and Rhythm: Normal rate and regular rhythm.     Heart sounds: Normal heart sounds. No murmur heard.    No friction rub. No gallop.  Pulmonary:     Effort: Pulmonary effort is normal. No respiratory distress.     Breath sounds: Normal breath sounds. No stridor. No wheezing, rhonchi or rales.  Musculoskeletal:        General: Normal range of motion.     Cervical back: Normal range of motion.  Skin:    General: Skin is warm and dry.  Neurological:     Mental Status: He is alert and oriented to person, place, and time. Mental status is at baseline.  Psychiatric:        Mood and Affect: Mood normal.        Behavior: Behavior normal.        Thought Content: Thought content normal.        Judgment: Judgment normal.

## 2023-05-17 ENCOUNTER — Ambulatory Visit: Payer: BC Managed Care – PPO | Admitting: Nurse Practitioner

## 2023-06-14 ENCOUNTER — Ambulatory Visit: Payer: BC Managed Care – PPO | Admitting: Nurse Practitioner

## 2023-06-14 VITALS — BP 140/84 | HR 97 | Temp 97.7°F | Ht 61.0 in | Wt 274.2 lb

## 2023-06-14 DIAGNOSIS — E785 Hyperlipidemia, unspecified: Secondary | ICD-10-CM | POA: Diagnosis not present

## 2023-06-14 DIAGNOSIS — E119 Type 2 diabetes mellitus without complications: Secondary | ICD-10-CM

## 2023-06-14 DIAGNOSIS — I10 Essential (primary) hypertension: Secondary | ICD-10-CM | POA: Diagnosis not present

## 2023-06-14 DIAGNOSIS — Z7985 Long-term (current) use of injectable non-insulin antidiabetic drugs: Secondary | ICD-10-CM

## 2023-06-14 DIAGNOSIS — Z7984 Long term (current) use of oral hypoglycemic drugs: Secondary | ICD-10-CM

## 2023-06-14 DIAGNOSIS — E669 Obesity, unspecified: Secondary | ICD-10-CM | POA: Insufficient documentation

## 2023-06-14 DIAGNOSIS — Z6841 Body Mass Index (BMI) 40.0 and over, adult: Secondary | ICD-10-CM

## 2023-06-14 MED ORDER — AMLODIPINE BESYLATE 10 MG PO TABS
10.0000 mg | ORAL_TABLET | Freq: Every day | ORAL | 3 refills | Status: DC
Start: 2023-06-14 — End: 2023-08-06

## 2023-06-14 NOTE — Patient Instructions (Signed)
[address]

## 2023-06-14 NOTE — Assessment & Plan Note (Signed)
Chronic Patient encouraged to continue focusing on lifestyle modification as well as to continue Ozempic for diabetes but hopefully this will also result in some weight loss.  Patient reports understanding.

## 2023-06-14 NOTE — Assessment & Plan Note (Signed)
Chronic Check lipid panel and metabolic panel today Further recommendations may be made based on his results. Continue rosuvastatin 20 mg daily.

## 2023-06-14 NOTE — Assessment & Plan Note (Signed)
Chronic Last A1c slightly above goal at 7.1 For now continue Ozempic 0.5 mg weekly and metformin 500 mg twice a day, recheck A1c at next in person office visit. Continue on rosuvastatin and telmisartan.

## 2023-06-14 NOTE — Assessment & Plan Note (Signed)
Chronic, above goal Blood pressure is improving, but still above goal.  Will increase amlodipine to 10 mg daily and continue on telmisartan 80 mg daily.  Patient overdue for recheck of metabolic panel, we will get this today to monitor kidney function electrolytes. Follow-up in 3 weeks.

## 2023-06-14 NOTE — Progress Notes (Signed)
Established Patient Office Visit  Subjective   Patient ID: Ernest Austin, male    DOB: Jun 03, 1986  Age: 37 y.o. MRN: [id number]  No chief complaint on file.   HTN/T2DM/Obesity/HLD: Continues on telmisartan 80 mg daily and recently started on amlodipine 5 mg daily.  Tolerating both medications well denies any side effects.  Last A1c 7.1, continues on Ozempic 0.5 mg weekly and metformin 500 mg twice a day.  Also tolerating well, denies any hypoglycemic events.  Started rosuvastatin 20 mg daily about 6 weeks ago, tolerating well.  Last LDL 176.    Review of Systems  Respiratory:  Negative for shortness of breath.   Cardiovascular:  Negative for chest pain.  Neurological:  Negative for headaches.      Objective:     BP (!) 140/84   Pulse 97   Temp 97.7 F (36.5 C) (Temporal)   Ht 5\' 1"  (1.549 m)   Wt 274 lb 4 oz (124.4 kg)   SpO2 95%   BMI 51.82 kg/m  BP Readings from Last 3 Encounters:  06/14/23 (!) 140/84  04/29/23 (!) 144/102  04/19/23 (!) 172/104   Wt Readings from Last 3 Encounters:  06/14/23 274 lb 4 oz (124.4 kg)  04/29/23 279 lb (126.6 kg)  04/19/23 219 lb (99.3 kg)      Physical Exam Vitals reviewed.  Constitutional:      Appearance: Normal appearance.  HENT:     Head: Normocephalic and atraumatic.  Cardiovascular:     Rate and Rhythm: Normal rate and regular rhythm.  Pulmonary:     Effort: Pulmonary effort is normal.     Breath sounds: Normal breath sounds.  Musculoskeletal:     Cervical back: Neck supple.  Skin:    General: Skin is warm and dry.  Neurological:     Mental Status: He is alert and oriented to person, place, and time.  Psychiatric:        Mood and Affect: Mood normal.        Behavior: Behavior normal.        Thought Content: Thought content normal.        Judgment: Judgment normal.      No results found for any visits on 06/14/23.    The ASCVD Risk score (Arnett DK, et al., 2019) failed to calculate for the following  reasons:   The 2019 ASCVD risk score is only valid for ages 46 to 10    Assessment & Plan:   Problem List Items Addressed This Visit       Cardiovascular and Mediastinum   HTN (hypertension)    Chronic, above goal Blood pressure is improving, but still above goal.  Will increase amlodipine to 10 mg daily and continue on telmisartan 80 mg daily.  Patient overdue for recheck of metabolic panel, we will get this today to monitor kidney function electrolytes. Follow-up in 3 weeks.      Relevant Medications   amLODipine (NORVASC) 10 MG tablet   Other Relevant Orders   Comprehensive metabolic panel     Endocrine   T2DM (type 2 diabetes mellitus) (HCC)    Chronic Last A1c slightly above goal at 7.1 For now continue Ozempic 0.5 mg weekly and metformin 500 mg twice a day, recheck A1c at next in person office visit. Continue on rosuvastatin and telmisartan.        Other   HLD (hyperlipidemia) - Primary    Chronic Check lipid panel and metabolic panel today Further  recommendations may be made based on his results. Continue rosuvastatin 20 mg daily.      Relevant Medications   amLODipine (NORVASC) 10 MG tablet   Other Relevant Orders   Lipid panel   Obesity    Chronic Patient encouraged to continue focusing on lifestyle modification as well as to continue Ozempic for diabetes but hopefully this will also result in some weight loss.  Patient reports understanding.       Return in about 3 weeks (around 07/05/2023) for F/U with Jariyah Hackley in person or virtual.    Elenore Paddy, NP

## 2023-06-17 LAB — COMPREHENSIVE METABOLIC PANEL
ALT: 24 U/L (ref 0–53)
AST: 17 U/L (ref 0–37)
Albumin: 4.5 g/dL (ref 3.5–5.2)
Alkaline Phosphatase: 79 U/L (ref 39–117)
BUN: 8 mg/dL (ref 6–23)
CO2: 30 mEq/L (ref 19–32)
Calcium: 10.1 mg/dL (ref 8.4–10.5)
Chloride: 103 mEq/L (ref 96–112)
Creatinine, Ser: 1.12 mg/dL (ref 0.40–1.50)
GFR: 83.88 mL/min (ref >60.00)
Glucose, Bld: 82 mg/dL (ref 70–99)
Potassium: 4.2 mEq/L (ref 3.5–5.1)
Sodium: 140 mEq/L (ref 135–145)
Total Bilirubin: 0.5 mg/dL (ref 0.2–1.2)
Total Protein: 8.3 g/dL (ref 6.0–8.3)

## 2023-06-17 LAB — LIPID PANEL
Cholesterol: 147 mg/dL (ref 0–200)
HDL: 23.9 mg/dL — ABNORMAL LOW (ref >39.00)
LDL Cholesterol: 106 mg/dL — ABNORMAL HIGH (ref 0–99)
NonHDL: 122.63
Total CHOL/HDL Ratio: 6
Triglycerides: 83 mg/dL (ref 0.0–149.0)
VLDL: 16.6 mg/dL (ref 0.0–40.0)

## 2023-07-09 ENCOUNTER — Encounter: Payer: Self-pay | Admitting: Nurse Practitioner

## 2023-07-12 ENCOUNTER — Telehealth (INDEPENDENT_AMBULATORY_CARE_PROVIDER_SITE_OTHER): Payer: BC Managed Care – PPO | Admitting: Nurse Practitioner

## 2023-07-12 DIAGNOSIS — I10 Essential (primary) hypertension: Secondary | ICD-10-CM

## 2023-07-12 DIAGNOSIS — E119 Type 2 diabetes mellitus without complications: Secondary | ICD-10-CM

## 2023-07-12 MED ORDER — SEMAGLUTIDE (1 MG/DOSE) 4 MG/3ML ~~LOC~~ SOPN
1.0000 mg | PEN_INJECTOR | SUBCUTANEOUS | 0 refills | Status: DC
Start: 2023-07-12 — End: 2023-10-25

## 2023-07-12 NOTE — Progress Notes (Signed)
   Established Patient Office Visit  An audio/visual tele-health visit was completed today for this patient. I connected with  Ernest Austin on 07/12/23 utilizing audio/visual technology and verified that I am speaking with the correct person using two identifiers. The patient was located at their home, and I was located at the office of [facility] at [facility] during the encounter. I discussed the limitations of evaluation and management by telemedicine. The patient expressed understanding and agreed to proceed.     Subjective   Patient ID: Ernest Austin, male    DOB: May 18, 1986  Age: 37 y.o. MRN: [id number]  Chief Complaint  Patient presents with   Hypertension    HTN/T2DM: Patient is on telmisartan 80 mg daily.  Recently increased amlodipine to 10 mg daily.  Tolerating medication well.  Reports at home readings are 120s over 80s, this is a great improvement compared to previous readings.  Also continues on metformin 500 mg twice a day, rosuvastatin, and Ozempic 0.5 mg weekly.  Wondering if he can increase Ozempic to 1 mg weekly.  Tolerating medication well and denies any chest pain, nausea, vomiting.  Reports blood sugars have remained stable.  Last A1c was 7.1  Recently tested positive for COVID-19 on home test.  Reports symptoms started over 1 week ago.  He is starting to feel better now.    Review of Systems  Constitutional:  Positive for malaise/fatigue.  Respiratory:  Negative for shortness of breath.   Cardiovascular:  Negative for chest pain.  Musculoskeletal:  Negative for myalgias.  Neurological:  Negative for headaches.      Objective:     There were no vitals taken for this visit. BP Readings from Last 3 Encounters:  06/14/23 (!) 140/84  04/29/23 (!) 144/102  04/19/23 (!) 172/104   Wt Readings from Last 3 Encounters:  06/14/23 274 lb 4 oz (124.4 kg)  04/29/23 279 lb (126.6 kg)  04/19/23 219 lb (99.3 kg)     Physical Exam: Comprehensive  physical exam not completed today as office visit was conducted remotely.  Patient peers well over video, no acute distress noted..  Patient was alert and oriented, and appeared to have appropriate judgment.     No results found for any visits on 07/12/23.    The ASCVD Risk score (Arnett DK, et al., 2019) failed to calculate for the following reasons:   The 2019 ASCVD risk score is only valid for ages 77 to 54    Assessment & Plan:   Problem List Items Addressed This Visit       Cardiovascular and Mediastinum   HTN (hypertension) - Primary    Chronic At home readings much better since increasing amlodipine to 10 mg/day. Continue amlodipine 10 mg daily and telmisartan 80 mg daily. Follow-up in 1 month for annual physical exam.        Endocrine   T2DM (type 2 diabetes mellitus) (HCC)    Chronic Last A1c 7.1 I am agreeable to increasing Ozempic to 1 mg/week. Also continue metformin 500 mg twice a day. Continue statin therapy and ARB. Follow-up in 1 month for repeat A1c and annual physical exam      Relevant Medications   Semaglutide, 1 MG/DOSE, 4 MG/3ML SOPN    Return in about 1 month (around 08/12/2023) for CPE with Nazier Neyhart.    Elenore Paddy, NP

## 2023-07-12 NOTE — Assessment & Plan Note (Signed)
Chronic Last A1c 7.1 I am agreeable to increasing Ozempic to 1 mg/week. Also continue metformin 500 mg twice a day. Continue statin therapy and ARB. Follow-up in 1 month for repeat A1c and annual physical exam

## 2023-07-12 NOTE — Assessment & Plan Note (Signed)
Chronic At home readings much better since increasing amlodipine to 10 mg/day. Continue amlodipine 10 mg daily and telmisartan 80 mg daily. Follow-up in 1 month for annual physical exam.

## 2023-07-25 ENCOUNTER — Encounter (INDEPENDENT_AMBULATORY_CARE_PROVIDER_SITE_OTHER): Payer: Self-pay

## 2023-08-03 ENCOUNTER — Other Ambulatory Visit: Payer: Self-pay | Admitting: Family Medicine

## 2023-08-03 ENCOUNTER — Other Ambulatory Visit: Payer: Self-pay | Admitting: Nurse Practitioner

## 2023-08-03 DIAGNOSIS — I10 Essential (primary) hypertension: Secondary | ICD-10-CM

## 2023-08-03 DIAGNOSIS — E119 Type 2 diabetes mellitus without complications: Secondary | ICD-10-CM

## 2023-08-06 MED ORDER — AMLODIPINE BESYLATE 10 MG PO TABS
10.0000 mg | ORAL_TABLET | Freq: Every day | ORAL | 3 refills | Status: DC
Start: 2023-08-06 — End: 2023-10-24

## 2023-08-06 NOTE — Telephone Encounter (Signed)
Please call patient and make sure he is aware that the new prescription is 10mg /tablet so he only needs to take 1 tablet by mouth daily. Thank you.

## 2023-08-16 ENCOUNTER — Encounter: Payer: Self-pay | Admitting: Pharmacist

## 2023-08-22 ENCOUNTER — Ambulatory Visit: Payer: BC Managed Care – PPO | Admitting: Nurse Practitioner

## 2023-08-22 ENCOUNTER — Encounter: Payer: Self-pay | Admitting: Nurse Practitioner

## 2023-08-29 ENCOUNTER — Encounter: Payer: BC Managed Care – PPO | Admitting: Nurse Practitioner

## 2023-08-29 ENCOUNTER — Encounter: Payer: Self-pay | Admitting: Nurse Practitioner

## 2023-09-20 ENCOUNTER — Other Ambulatory Visit: Payer: Self-pay | Admitting: Family Medicine

## 2023-09-20 DIAGNOSIS — I10 Essential (primary) hypertension: Secondary | ICD-10-CM

## 2023-10-24 ENCOUNTER — Ambulatory Visit: Payer: BC Managed Care – PPO | Admitting: Nurse Practitioner

## 2023-10-24 VITALS — BP 164/108 | HR 96 | Temp 98.2°F | Ht 61.0 in | Wt 271.4 lb

## 2023-10-24 DIAGNOSIS — E559 Vitamin D deficiency, unspecified: Secondary | ICD-10-CM | POA: Diagnosis not present

## 2023-10-24 DIAGNOSIS — Z7984 Long term (current) use of oral hypoglycemic drugs: Secondary | ICD-10-CM

## 2023-10-24 DIAGNOSIS — I10 Essential (primary) hypertension: Secondary | ICD-10-CM | POA: Diagnosis not present

## 2023-10-24 DIAGNOSIS — E119 Type 2 diabetes mellitus without complications: Secondary | ICD-10-CM

## 2023-10-24 DIAGNOSIS — Z7985 Long-term (current) use of injectable non-insulin antidiabetic drugs: Secondary | ICD-10-CM

## 2023-10-24 DIAGNOSIS — Z23 Encounter for immunization: Secondary | ICD-10-CM

## 2023-10-24 DIAGNOSIS — Z0001 Encounter for general adult medical examination with abnormal findings: Secondary | ICD-10-CM

## 2023-10-24 LAB — COMPREHENSIVE METABOLIC PANEL
ALT: 22 U/L (ref 0–53)
AST: 17 U/L (ref 0–37)
Albumin: 4.3 g/dL (ref 3.5–5.2)
Alkaline Phosphatase: 82 U/L (ref 39–117)
BUN: 8 mg/dL (ref 6–23)
CO2: 30 meq/L (ref 19–32)
Calcium: 9.1 mg/dL (ref 8.4–10.5)
Chloride: 104 meq/L (ref 96–112)
Creatinine, Ser: 1.15 mg/dL (ref 0.40–1.50)
GFR: 81.06 mL/min (ref >60.00)
Glucose, Bld: 80 mg/dL (ref 70–99)
Potassium: 3.9 meq/L (ref 3.5–5.1)
Sodium: 139 meq/L (ref 135–145)
Total Bilirubin: 0.4 mg/dL (ref 0.2–1.2)
Total Protein: 7.6 g/dL (ref 6.0–8.3)

## 2023-10-24 LAB — HEMOGLOBIN A1C: Hgb A1c MFr Bld: 6.1 % (ref 4.6–6.5)

## 2023-10-24 LAB — VITAMIN D 25 HYDROXY (VIT D DEFICIENCY, FRACTURES): VITD: 32.3 ng/mL (ref 30.00–100.00)

## 2023-10-24 MED ORDER — AMLODIPINE BESYLATE 10 MG PO TABS: 10.0000 mg | ORAL_TABLET | Freq: Every day | ORAL | 3 refills | Status: AC

## 2023-10-24 NOTE — Assessment & Plan Note (Signed)
Chronic, stable Based on at home blood pressure readings blood pressure is controlled, question whether or not he may have whitecoat syndrome.  Additionally he is out of his amlodipine today which may be contributing to elevated reading today. For now continue on amlodipine 10 mg daily and telmisartan 80 mg daily.  Check CMP today.

## 2023-10-24 NOTE — Assessment & Plan Note (Signed)
Will collect vitamin D serum level, further recommendations may be made based upon these results.

## 2023-10-24 NOTE — Assessment & Plan Note (Signed)
Chronic, stable A1c slightly above goal last check Will check A1c today In the meantime patient to continue on metformin 500 mg twice a day and Ozempic 1 mg weekly.  Also will continue on rosuvastatin and ARB. Patient encouraged to have diabetic eye exam completed and he plans on doing so.

## 2023-10-24 NOTE — Assessment & Plan Note (Signed)
Flu shot and Tdap administered, VIS provided

## 2023-10-24 NOTE — Progress Notes (Signed)
Complete physical exam  Patient: Ernest Austin   DOB: 09-Jan-1986   36 y.o. Male  MRN: [id number]  Subjective:    Chief Complaint  Patient presents with   Annual Exam    Ernest Austin is a 37 y.o. male who presents today for a complete physical exam. He reports consuming a general diet.  Exercise: walks 4x/week for 60 minutes a day   He generally feels well. He reports sleeping well. He does not have additional problems to discuss today.   Of note, he has history of vitamin D deficiency. Not currently on supplement. Has T2DM with last A1C 7.1. Due for recheck today. Has HTN, monitors BP at home (continues on amlodipine [has been out of this for 1 week] and telmisartan, is tolerating this well) -- reports his at home BP readings are in the 120s/70s-80s.   Most recent fall risk assessment:    07/12/2023    3:29 PM  Fall Risk   Falls in the past year? 0  Number falls in past yr: 0  Injury with Fall? 0  Risk for fall due to : No Fall Risks  Follow up Falls evaluation completed     Most recent depression screenings:    07/12/2023    3:29 PM 06/14/2023    4:23 PM  PHQ 2/9 Scores  PHQ - 2 Score 0 0    Vision:Within last year and Dental: Current dental problems and Receives regular dental care (needs wisdom teeth removed).  Past Medical History:  Diagnosis Date   Diabetes mellitus without complication (HCC)    Past Surgical History:  Procedure Laterality Date   NO PAST SURGERIES     Social History   Socioeconomic History   Marital status: Single    Spouse name: Not on file   Number of children: Not on file   Years of education: Not on file   Highest education level: Associate degree: occupational, Scientist, product/process development, or vocational program  Occupational History   Not on file  Tobacco Use   Smoking status: Former    Current packs/day: 0.00    Types: Cigarettes    Quit date: 2016    Years since quitting: 8.8   Smokeless tobacco: Never  Substance and Sexual Activity    Alcohol use: Not Currently   Drug use: Never   Sexual activity: Not on file  Other Topics Concern   Not on file  Social History Narrative   Not on file   Social Determinants of Health   Financial Resource Strain: Medium Risk (10/24/2023)   Overall Financial Resource Strain (CARDIA)    Difficulty of Paying Living Expenses: Somewhat hard  Food Insecurity: Food Insecurity Present (10/24/2023)   Hunger Vital Sign    Worried About Running Out of Food in the Last Year: Never true    Ran Out of Food in the Last Year: Sometimes true  Transportation Needs: No Transportation Needs (10/24/2023)   PRAPARE - Administrator, Civil Service (Medical): No    Lack of Transportation (Non-Medical): No  Physical Activity: Insufficiently Active (10/24/2023)   Exercise Vital Sign    Days of Exercise per Week: 2 days    Minutes of Exercise per Session: 60 min  Stress: No Stress Concern Present (10/24/2023)   [employer] of Occupational Health - Occupational Stress Questionnaire    Feeling of Stress : Only a little  Social Connections: Moderately Integrated (10/24/2023)   Social Connection and Isolation Panel [NHANES]  Frequency of Communication with Friends and Family: More than three times a week    Frequency of Social Gatherings with Friends and Family: Once a week    Attends Religious Services: More than 4 times per year    Active Member of [employer] or Organizations: Yes    Attends Banker Meetings: More than 4 times per year    Marital Status: Separated  Intimate Partner Violence: Unknown (02/21/2023)   Received from [employer], [facility]   HITS    Physically Hurt: Not on file    Insult or Talk Down To: Not on file    Threaten Physical Harm: Not on file    Scream or Curse: Not on file   Family History  Problem Relation Age of Onset   Diabetes Mother    Hypertension Father    Heart attack Neg Hx    Stroke Neg Hx    No Known Allergies    Patient Care  Team: Elenore Paddy, NP as PCP - General (Nurse Practitioner)   Outpatient Medications Prior to Visit  Medication Sig   blood glucose meter kit and supplies KIT Dispense based on patient and insurance preference. Use up to twice daily. (FOR ICD-10 E.11.89).   Lancets (ONETOUCH DELICA PLUS LANCET30G) MISC TEST 1-2 TIMES A DAY   metFORMIN (GLUCOPHAGE-XR) 500 MG 24 hr tablet Take 1 tablet (500 mg total) by mouth 2 (two) times daily with a meal.   ONETOUCH VERIO test strip TEST 1-2 TIMES A DAY   rosuvastatin (CRESTOR) 20 MG tablet Take 1 tablet (20 mg total) by mouth daily.   Semaglutide, 1 MG/DOSE, 4 MG/3ML SOPN Inject 1 mg as directed once a week.   telmisartan (MICARDIS) 80 MG tablet Take 1 tablet (80 mg total) by mouth daily.   [DISCONTINUED] amLODipine (NORVASC) 10 MG tablet Take 1 tablet (10 mg total) by mouth daily.   No facility-administered medications prior to visit.    Review of Systems  Constitutional:  Negative for fever and weight loss.  HENT:  Negative for hearing loss and tinnitus.   Respiratory:  Negative for shortness of breath.   Cardiovascular:  Negative for chest pain and palpitations.  Gastrointestinal:  Negative for abdominal pain, blood in stool, nausea and vomiting.  Genitourinary:  Negative for hematuria.  Neurological:  Negative for seizures and loss of consciousness.  Psychiatric/Behavioral:  Negative for suicidal ideas.           Objective:     BP (!) 164/108   Pulse 96   Temp 98.2 F (36.8 C) (Temporal)   Ht 5\' 1"  (1.549 m)   Wt 271 lb 6 oz (123.1 kg)   SpO2 97%   BMI 51.28 kg/m  BP Readings from Last 3 Encounters:  10/24/23 (!) 164/108  06/14/23 (!) 140/84  04/29/23 (!) 144/102   Wt Readings from Last 3 Encounters:  10/24/23 271 lb 6 oz (123.1 kg)  06/14/23 274 lb 4 oz (124.4 kg)  04/29/23 279 lb (126.6 kg)      Physical Exam Vitals reviewed.  Constitutional:      General: He is not in acute distress.    Appearance: Normal  appearance. He is not ill-appearing.  HENT:     Head: Normocephalic and atraumatic.     Right Ear: Tympanic membrane, ear canal and external ear normal.     Left Ear: Tympanic membrane, ear canal and external ear normal.  Eyes:     General: No scleral icterus.  Extraocular Movements: Extraocular movements intact.     Conjunctiva/sclera: Conjunctivae normal.     Pupils: Pupils are equal, round, and reactive to light.  Neck:     Vascular: No carotid bruit.  Cardiovascular:     Rate and Rhythm: Normal rate and regular rhythm.     Pulses: Normal pulses.     Heart sounds: Normal heart sounds.  Pulmonary:     Effort: Pulmonary effort is normal.     Breath sounds: Normal breath sounds.  Abdominal:     General: Bowel sounds are normal. There is no distension.     Palpations: There is no mass.     Tenderness: There is no abdominal tenderness.     Hernia: No hernia is present.  Musculoskeletal:        General: No swelling or tenderness.     Cervical back: Normal range of motion and neck supple. No rigidity.  Lymphadenopathy:     Cervical: No cervical adenopathy.  Skin:    General: Skin is warm and dry.  Neurological:     General: No focal deficit present.     Mental Status: He is alert and oriented to person, place, and time.     Cranial Nerves: No cranial nerve deficit.     Sensory: No sensory deficit.     Motor: No weakness.     Gait: Gait normal.  Psychiatric:        Mood and Affect: Mood normal.        Behavior: Behavior normal.        Judgment: Judgment normal.      No results found for any visits on 10/24/23.     Assessment & Plan:    Routine Health Maintenance and Physical Exam  Immunization History  Administered Date(s) Administered   Influenza-Unspecified 09/23/2021    Health Maintenance  Topic Date Due   OPHTHALMOLOGY EXAM  Never done   DTaP/Tdap/Td (1 - Tdap) Never done   INFLUENZA VACCINE  07/11/2023   COVID-19 Vaccine (1 - 2023-24 season) Never done    HEMOGLOBIN A1C  09/21/2023   Diabetic kidney evaluation - Urine ACR  03/21/2024   FOOT EXAM  03/21/2024   Diabetic kidney evaluation - eGFR measurement  06/16/2024   Hepatitis C Screening  Completed   HIV Screening  Completed   HPV VACCINES  Aged Out    Discussed health benefits of physical activity, and encouraged him to engage in regular exercise appropriate for his age and condition.  Problem List Items Addressed This Visit       Cardiovascular and Mediastinum   HTN (hypertension)    Chronic, stable Based on at home blood pressure readings blood pressure is controlled, question whether or not he may have whitecoat syndrome.  Additionally he is out of his amlodipine today which may be contributing to elevated reading today. For now continue on amlodipine 10 mg daily and telmisartan 80 mg daily.  Check CMP today.      Relevant Medications   amLODipine (NORVASC) 10 MG tablet   Other Relevant Orders   Hemoglobin A1c   Comprehensive metabolic panel   VITAMIN D 25 Hydroxy (Vit-D Deficiency, Fractures)     Endocrine   T2DM (type 2 diabetes mellitus) (HCC)    Chronic, stable A1c slightly above goal last check Will check A1c today In the meantime patient to continue on metformin 500 mg twice a day and Ozempic 1 mg weekly.  Also will continue on rosuvastatin and ARB. Patient encouraged to  have diabetic eye exam completed and he plans on doing so.      Relevant Orders   Hemoglobin A1c   Comprehensive metabolic panel   VITAMIN D 25 Hydroxy (Vit-D Deficiency, Fractures)     Other   Vitamin D deficiency    Will collect vitamin D serum level, further recommendations may be made based upon these results.      Relevant Orders   VITAMIN D 25 Hydroxy (Vit-D Deficiency, Fractures)   Needs flu shot    Flu shot and Tdap administered, VIS provided      Encounter for general adult medical examination with abnormal findings - Primary    Overall exam within normal limits.  Discussed  preventative screening recommendations that are upcoming as he gets older.  Handout provided as well.  Flu shot administered, VIS provided.  Tdap administered, VIS provided.      Return in about 6 months (around 04/22/2024) for F/U with Maralyn Sago.     Elenore Paddy, NP

## 2023-10-24 NOTE — Assessment & Plan Note (Signed)
Overall exam within normal limits.  Discussed preventative screening recommendations that are upcoming as he gets older.  Handout provided as well.  Flu shot administered, VIS provided.  Tdap administered, VIS provided.

## 2023-10-25 ENCOUNTER — Other Ambulatory Visit: Payer: Self-pay | Admitting: Nurse Practitioner

## 2023-10-25 DIAGNOSIS — E119 Type 2 diabetes mellitus without complications: Secondary | ICD-10-CM

## 2023-10-28 MED ORDER — SEMAGLUTIDE (1 MG/DOSE) 4 MG/3ML ~~LOC~~ SOPN: 1.0000 mg | PEN_INJECTOR | SUBCUTANEOUS | 0 refills | Status: DC

## 2024-02-23 ENCOUNTER — Other Ambulatory Visit: Payer: Self-pay | Admitting: Nurse Practitioner

## 2024-02-23 DIAGNOSIS — E119 Type 2 diabetes mellitus without complications: Secondary | ICD-10-CM

## 2024-02-28 ENCOUNTER — Encounter: Payer: Self-pay | Admitting: Nurse Practitioner

## 2024-03-07 ENCOUNTER — Other Ambulatory Visit: Payer: Self-pay | Admitting: Nurse Practitioner

## 2024-03-07 DIAGNOSIS — I10 Essential (primary) hypertension: Secondary | ICD-10-CM

## 2024-04-03 ENCOUNTER — Other Ambulatory Visit: Payer: Self-pay | Admitting: Nurse Practitioner

## 2024-04-03 DIAGNOSIS — E119 Type 2 diabetes mellitus without complications: Secondary | ICD-10-CM

## 2024-04-23 ENCOUNTER — Ambulatory Visit: Payer: BC Managed Care – PPO | Admitting: Nurse Practitioner

## 2024-05-08 ENCOUNTER — Ambulatory Visit: Admitting: Nurse Practitioner

## 2024-05-08 VITALS — BP 138/84 | HR 82 | Temp 98.4°F | Ht 61.0 in

## 2024-05-08 DIAGNOSIS — Z7985 Long-term (current) use of injectable non-insulin antidiabetic drugs: Secondary | ICD-10-CM | POA: Diagnosis not present

## 2024-05-08 DIAGNOSIS — I10 Essential (primary) hypertension: Secondary | ICD-10-CM

## 2024-05-08 DIAGNOSIS — E119 Type 2 diabetes mellitus without complications: Secondary | ICD-10-CM | POA: Diagnosis not present

## 2024-05-08 DIAGNOSIS — E785 Hyperlipidemia, unspecified: Secondary | ICD-10-CM | POA: Diagnosis not present

## 2024-05-08 MED ORDER — SEMAGLUTIDE (2 MG/DOSE) 8 MG/3ML ~~LOC~~ SOPN: 2.0000 mg | PEN_INJECTOR | SUBCUTANEOUS | 6 refills | Status: AC

## 2024-05-08 NOTE — Progress Notes (Signed)
 Established Patient Office Visit  Subjective   Patient ID: Ernest Austin, male    DOB: 02-Apr-1986  Age: 38 y.o. MRN: [id number]  Chief Complaint  Patient presents with   Hypertension   Hypertension/hyperlipidemia/type 2 diabetes: Currently on amlodipine  10 mg daily, telmisartan  80 mg daily, metformin  500 mg twice daily, Ozempic  1 mg weekly, rosuvastatin  20 mg daily.  Tolerating all medication well.  Last A1c 6.1 last LDL 106.  He is due for diabetic eye exam and foot exam.      Review of Systems  Eyes:  Negative for blurred vision.  Cardiovascular:  Negative for chest pain.  Neurological:  Negative for dizziness and headaches.      Objective:     BP 138/84   Pulse 82   Temp 98.4 F (36.9 C) (Temporal)   Ht 5\' 1"  (1.549 m)   SpO2 98%   BMI 51.28 kg/m  BP Readings from Last 3 Encounters:  05/08/24 138/84  10/24/23 (!) 164/108  06/14/23 (!) 140/84   Wt Readings from Last 3 Encounters:  10/24/23 271 lb 6 oz (123.1 kg)  06/14/23 274 lb 4 oz (124.4 kg)  04/29/23 279 lb (126.6 kg)     Diabetic Foot Exam - Simple   Simple Foot Form Diabetic Foot exam was performed with the following findings: Yes 05/08/2024  9:06 AM  Visual Inspection No deformities, no ulcerations, no other skin breakdown bilaterally: Yes Sensation Testing Intact to touch and monofilament testing bilaterally: Yes Pulse Check Posterior Tibialis and Dorsalis pulse intact bilaterally: Yes Comments      Physical Exam Vitals reviewed.  Constitutional:      Appearance: Normal appearance.  HENT:     Head: Normocephalic and atraumatic.  Cardiovascular:     Rate and Rhythm: Normal rate and regular rhythm.  Pulmonary:     Effort: Pulmonary effort is normal.     Breath sounds: Normal breath sounds.  Musculoskeletal:     Cervical back: Neck supple.  Skin:    General: Skin is warm and dry.  Neurological:     Mental Status: He is alert and oriented to person, place, and time.  Psychiatric:         Mood and Affect: Mood normal.        Behavior: Behavior normal.        Thought Content: Thought content normal.        Judgment: Judgment normal.      No results found for any visits on 05/08/24.  Last metabolic panel Lab Results  Component Value Date   GLUCOSE 80 10/24/2023   NA 139 10/24/2023   K 3.9 10/24/2023   CL 104 10/24/2023   CO2 30 10/24/2023   BUN 8 10/24/2023   CREATININE 1.15 10/24/2023   GFR 81.06 10/24/2023   CALCIUM  9.1 10/24/2023   PROT 7.6 10/24/2023   ALBUMIN 4.3 10/24/2023   LABGLOB 2.7 06/26/2019   AGRATIO 1.6 06/26/2019   BILITOT 0.4 10/24/2023   ALKPHOS 82 10/24/2023   AST 17 10/24/2023   ALT 22 10/24/2023   Last lipids Lab Results  Component Value Date   CHOL 147 06/17/2023   HDL 23.90 (L) 06/17/2023   LDLCALC 106 (H) 06/17/2023   TRIG 83.0 06/17/2023   CHOLHDL 6 06/17/2023   Last hemoglobin A1c Lab Results  Component Value Date   HGBA1C 6.1 10/24/2023      The ASCVD Risk score (Arnett DK, et al., 2019) failed to calculate for the following reasons:  The 2019 ASCVD risk score is only valid for ages 41 to 60    Assessment & Plan:   Problem List Items Addressed This Visit       Cardiovascular and Mediastinum   HTN (hypertension)   Chronic, stable Continue amlodipine  10 mg daily and telmisartan  80 mg daily        Endocrine   T2DM (type 2 diabetes mellitus) (HCC) - Primary   Chronic, stable, at goal Last A1c 6.1 We discussed increasing Ozempic  to 2 mg/week for the additional weight loss benefit.  He would like to do this.  Thus, new prescription for Ozempic  2 mg/week sent to pharmacy.  Patient will also continue on metformin  500 mg twice daily.  He will continue on olmesartan.  He will continue on rosuvastatin .      Relevant Medications   Semaglutide , 2 MG/DOSE, 8 MG/3ML SOPN   Other Relevant Orders   Ambulatory referral to Ophthalmology     Other   HLD (hyperlipidemia)   Chronic, stable Continue rosuvastatin  20  mg daily Plan on checking lipid panel at next office visit, may consider dose adjustment if LDL remains greater than 100       Return in about 6 months (around 11/08/2024) for CPE with Isa Manuel.    Zorita Hiss, NP

## 2024-05-08 NOTE — Assessment & Plan Note (Signed)
 Chronic, stable, at goal Last A1c 6.1 We discussed increasing Ozempic  to 2 mg/week for the additional weight loss benefit.  He would like to do this.  Thus, new prescription for Ozempic  2 mg/week sent to pharmacy.  Patient will also continue on metformin  500 mg twice daily.  He will continue on olmesartan.  He will continue on rosuvastatin .

## 2024-05-08 NOTE — Assessment & Plan Note (Signed)
 Chronic, stable Continue amlodipine  10 mg daily and telmisartan  80 mg daily

## 2024-05-08 NOTE — Patient Instructions (Signed)
 [facility] - [phone]

## 2024-05-08 NOTE — Assessment & Plan Note (Signed)
 Chronic, stable Continue rosuvastatin  20 mg daily Plan on checking lipid panel at next office visit, may consider dose adjustment if LDL remains greater than 100

## 2024-10-22 ENCOUNTER — Other Ambulatory Visit: Payer: Self-pay | Admitting: Nurse Practitioner

## 2024-10-22 DIAGNOSIS — I10 Essential (primary) hypertension: Secondary | ICD-10-CM

## 2024-11-12 ENCOUNTER — Encounter: Admitting: Nurse Practitioner

## 2024-12-25 ENCOUNTER — Ambulatory Visit: Admitting: Nurse Practitioner

## 2025-02-11 ENCOUNTER — Ambulatory Visit: Admitting: Nurse Practitioner
